# Patient Record
Sex: Male | Born: 1945 | Race: White | Hispanic: No | Marital: Married | Smoking: Former smoker
Health system: Southern US, Community
[De-identification: ages and names within clinical notes are randomized; demographics above are authoritative.]

## PROBLEM LIST (undated history)

## (undated) DIAGNOSIS — H9190 Unspecified hearing loss, unspecified ear: Secondary | ICD-10-CM

## (undated) DIAGNOSIS — L570 Actinic keratosis: Secondary | ICD-10-CM

## (undated) DIAGNOSIS — C4492 Squamous cell carcinoma of skin, unspecified: Secondary | ICD-10-CM

## (undated) DIAGNOSIS — M1990 Unspecified osteoarthritis, unspecified site: Secondary | ICD-10-CM

## (undated) DIAGNOSIS — C801 Malignant (primary) neoplasm, unspecified: Secondary | ICD-10-CM

## (undated) DIAGNOSIS — Z8619 Personal history of other infectious and parasitic diseases: Secondary | ICD-10-CM

## (undated) DIAGNOSIS — H409 Unspecified glaucoma: Secondary | ICD-10-CM

## (undated) HISTORY — PX: TONSILLECTOMY: SUR1361

## (undated) HISTORY — DX: Actinic keratosis: L57.0

## (undated) HISTORY — PX: PILONIDAL CYST EXCISION: SHX744

## (undated) HISTORY — PX: GANGLION CYST EXCISION: SHX1691

## (undated) HISTORY — DX: Squamous cell carcinoma of skin, unspecified: C44.92

---

## 2012-06-26 DIAGNOSIS — Z8619 Personal history of other infectious and parasitic diseases: Secondary | ICD-10-CM

## 2012-06-26 HISTORY — DX: Personal history of other infectious and parasitic diseases: Z86.19

## 2014-08-03 ENCOUNTER — Ambulatory Visit: Payer: Self-pay | Admitting: Physician Assistant

## 2014-08-03 LAB — CBC WITH DIFFERENTIAL/PLATELET
Basophil #: 0.1 10*3/uL (ref 0.0–0.1)
Basophil %: 0.9 %
EOS PCT: 1.6 %
Eosinophil #: 0.1 10*3/uL (ref 0.0–0.7)
HCT: 38.4 % — ABNORMAL LOW (ref 40.0–52.0)
HGB: 13.1 g/dL (ref 13.0–18.0)
Lymphocyte #: 1.8 10*3/uL (ref 1.0–3.6)
Lymphocyte %: 19 %
MCH: 30.8 pg (ref 26.0–34.0)
MCHC: 34.1 g/dL (ref 32.0–36.0)
MCV: 91 fL (ref 80–100)
MONO ABS: 0.6 x10 3/mm (ref 0.2–1.0)
Monocyte %: 6.3 %
NEUTROS ABS: 6.7 10*3/uL — AB (ref 1.4–6.5)
Neutrophil %: 72.2 %
PLATELETS: 251 10*3/uL (ref 150–440)
RBC: 4.24 10*6/uL — ABNORMAL LOW (ref 4.40–5.90)
RDW: 12.7 % (ref 11.5–14.5)
WBC: 9.3 10*3/uL (ref 3.8–10.6)

## 2014-08-03 LAB — URIC ACID: Uric Acid: 6.1 mg/dL (ref 3.5–7.2)

## 2014-08-06 ENCOUNTER — Ambulatory Visit: Payer: Self-pay

## 2016-06-16 ENCOUNTER — Encounter
Admission: RE | Admit: 2016-06-16 | Discharge: 2016-06-16 | Disposition: A | Discharge status: Home / Self Care | Payer: Medicare Other | Source: Ambulatory Visit | Attending: Surgery | Admitting: Surgery

## 2016-06-16 DIAGNOSIS — K4090 Unilateral inguinal hernia, without obstruction or gangrene, not specified as recurrent: Secondary | ICD-10-CM | POA: Diagnosis not present

## 2016-06-16 DIAGNOSIS — Z01810 Encounter for preprocedural cardiovascular examination: Secondary | ICD-10-CM | POA: Diagnosis present

## 2016-06-16 DIAGNOSIS — E785 Hyperlipidemia, unspecified: Secondary | ICD-10-CM | POA: Diagnosis not present

## 2016-06-16 HISTORY — DX: Malignant (primary) neoplasm, unspecified: C80.1

## 2016-06-16 HISTORY — DX: Unspecified hearing loss, unspecified ear: H91.90

## 2016-06-16 HISTORY — DX: Unspecified osteoarthritis, unspecified site: M19.90

## 2016-06-16 HISTORY — DX: Personal history of other infectious and parasitic diseases: Z86.19

## 2016-06-16 HISTORY — DX: Unspecified glaucoma: H40.9

## 2016-06-16 NOTE — Patient Instructions (Signed)
  Your procedure is scheduled on: June 23, 2916 (Friday) Report to Same Day Surgery 2nd floor medical mall [facility] [address] Entrance-take elevator on left to 2nd floor.  Check in with surgery information desk.) To find out your arrival time please call [phone] between 1PM - 3PM on June 22, 2016 (Thursday(  Remember: Instructions that are not followed completely may result in serious medical risk, up to and including death, or upon the discretion of your surgeon and anesthesiologist your surgery may need to be rescheduled.    _x___ 1. Do not eat food or drink liquids after midnight. No gum chewing or hard candies.     __x__ 2. No Alcohol for 24 hours before or after surgery.   __x__3. No Smoking for 24 prior to surgery.   ____  4. Bring all medications with you on the day of surgery if instructed.    __x__ 5. Notify your doctor if there is any change in your medical condition     (cold, fever, infections).     Do not wear jewelry, make-up, hairpins, clips or nail polish.  Do not wear lotions, powders, or perfumes. You may wear deodorant.  Do not shave 48 hours prior to surgery. Men may shave face and neck.  Do not bring valuables to the hospital.    [facility] is not responsible for any belongings or valuables.               Contacts, dentures or bridgework may not be worn into surgery.  Leave your suitcase in the car. After surgery it may be brought to your room.  For patients admitted to the hospital, discharge time is determined by your treatment team.   Patients discharged the day of surgery will not be allowed to drive home.  You will need someone to drive you home and stay with you the night of your procedure.    Please read over the following fact sheets that you were given:   [facility] Preparing for Surgery and or MRSA Information   __ Take these medicines the morning of surgery with A SIP OF WATER:    1.   2.  3.  4.  5.  6.  ____Fleets enema or  Magnesium Citrate as directed.   _x___ Use CHG Soap or sage wipes as directed on instruction sheet   ____ Use inhalers on the day of surgery and bring to hospital day of surgery  ____ Stop metformin 2 days prior to surgery    ____ Take 1/2 of usual insulin dose the night before surgery and none on the morning of           surgery.   _x___ Stop Aspirin, Coumadin, Pllavix ,Eliquis, Effient, or Pradaxa (STOP ASPIRIN TODAY)  x__ Stop Anti-inflammatories such as Advil, Aleve, Ibuprofen, Motrin, Naproxen,          Naprosyn, Goodies powders or aspirin products. Ok to take Tylenol.   _x___ Stop supplements until after surgery.   (STOP GLUCOSAMINE CHONDROITIN NOW)  ____ Bring C-Pap to the hospital.

## 2016-06-23 ENCOUNTER — Ambulatory Visit: Payer: Medicare Other | Admitting: Certified Registered"

## 2016-06-23 ENCOUNTER — Ambulatory Visit
Admission: RE | Admit: 2016-06-23 | Discharge: 2016-06-23 | Disposition: A | Discharge status: Home / Self Care | Payer: Medicare Other | Source: Ambulatory Visit | Attending: Surgery | Admitting: Surgery

## 2016-06-23 ENCOUNTER — Encounter
Admission: RE | Disposition: A | Discharge status: Home / Self Care | Payer: Self-pay | Source: Ambulatory Visit | Attending: Surgery

## 2016-06-23 ENCOUNTER — Encounter: Payer: Self-pay | Admitting: *Deleted

## 2016-06-23 DIAGNOSIS — D176 Benign lipomatous neoplasm of spermatic cord: Secondary | ICD-10-CM | POA: Diagnosis not present

## 2016-06-23 DIAGNOSIS — Z7982 Long term (current) use of aspirin: Secondary | ICD-10-CM | POA: Insufficient documentation

## 2016-06-23 DIAGNOSIS — K4020 Bilateral inguinal hernia, without obstruction or gangrene, not specified as recurrent: Secondary | ICD-10-CM | POA: Insufficient documentation

## 2016-06-23 DIAGNOSIS — M1990 Unspecified osteoarthritis, unspecified site: Secondary | ICD-10-CM | POA: Diagnosis not present

## 2016-06-23 DIAGNOSIS — Z87891 Personal history of nicotine dependence: Secondary | ICD-10-CM | POA: Diagnosis not present

## 2016-06-23 HISTORY — PX: INGUINAL HERNIA REPAIR: SHX194

## 2016-06-23 SURGERY — REPAIR, HERNIA, INGUINAL, ADULT
Anesthesia: General | Laterality: Bilateral | Wound class: Clean Contaminated

## 2016-06-23 MED ORDER — FENTANYL CITRATE (PF) 100 MCG/2ML IJ SOLN
INTRAMUSCULAR | Status: DC | PRN
  Administered 2016-06-23 (×4): 50 ug via INTRAVENOUS

## 2016-06-23 MED ORDER — ONDANSETRON HCL 4 MG/2ML IJ SOLN
INTRAMUSCULAR | Status: DC | PRN
Start: 2016-06-23 — End: 2016-06-23
  Administered 2016-06-23: 4 mg via INTRAVENOUS

## 2016-06-23 MED ORDER — MIDAZOLAM HCL 2 MG/2ML IJ SOLN
INTRAMUSCULAR | Status: DC | PRN
  Administered 2016-06-23: 2 mg via INTRAVENOUS

## 2016-06-23 MED ORDER — ONDANSETRON HCL 4 MG/2ML IJ SOLN
INTRAMUSCULAR | Status: AC
  Filled 2016-06-23: qty 2

## 2016-06-23 MED ORDER — PROPOFOL 10 MG/ML IV BOLUS
INTRAVENOUS | Status: AC
  Filled 2016-06-23: qty 20

## 2016-06-23 MED ORDER — PHENYLEPHRINE 40 MCG/ML (10ML) SYRINGE FOR IV PUSH (FOR BLOOD PRESSURE SUPPORT)
PREFILLED_SYRINGE | INTRAVENOUS | Status: AC
  Filled 2016-06-23: qty 10

## 2016-06-23 MED ORDER — FENTANYL CITRATE (PF) 250 MCG/5ML IJ SOLN
INTRAMUSCULAR | Status: AC
  Filled 2016-06-23: qty 5

## 2016-06-23 MED ORDER — PHENYLEPHRINE HCL 10 MG/ML IJ SOLN
INTRAMUSCULAR | Status: DC | PRN
  Administered 2016-06-23 (×3): 120 ug via INTRAVENOUS

## 2016-06-23 MED ORDER — BUPIVACAINE HCL (PF) 0.5 % IJ SOLN
INTRAMUSCULAR | Status: AC
  Filled 2016-06-23: qty 30

## 2016-06-23 MED ORDER — FAMOTIDINE 20 MG PO TABS
ORAL_TABLET | ORAL | Status: AC
  Administered 2016-06-23: 20 mg
  Filled 2016-06-23: qty 1

## 2016-06-23 MED ORDER — LACTATED RINGERS IV SOLN
INTRAVENOUS | Status: DC
  Administered 2016-06-23: 09:00:00 via INTRAVENOUS

## 2016-06-23 MED ORDER — ROCURONIUM BROMIDE 100 MG/10ML IV SOLN
INTRAVENOUS | Status: DC | PRN
  Administered 2016-06-23: 30 mg via INTRAVENOUS
  Administered 2016-06-23 (×2): 20 mg via INTRAVENOUS
  Administered 2016-06-23: 30 mg via INTRAVENOUS

## 2016-06-23 MED ORDER — CEFAZOLIN SODIUM-DEXTROSE 2-4 GM/100ML-% IV SOLN
INTRAVENOUS | Status: AC
  Filled 2016-06-23: qty 100

## 2016-06-23 MED ORDER — DEXAMETHASONE SODIUM PHOSPHATE 10 MG/ML IJ SOLN
INTRAMUSCULAR | Status: DC | PRN
  Administered 2016-06-23: 10 mg via INTRAVENOUS

## 2016-06-23 MED ORDER — ROCURONIUM BROMIDE 50 MG/5ML IV SOSY
PREFILLED_SYRINGE | INTRAVENOUS | Status: AC
  Filled 2016-06-23: qty 5

## 2016-06-23 MED ORDER — FAMOTIDINE 20 MG PO TABS: 20.0000 mg | ORAL_TABLET | Freq: Once | ORAL | Status: DC

## 2016-06-23 MED ORDER — BUPIVACAINE-EPINEPHRINE (PF) 0.25% -1:200000 IJ SOLN
INTRAMUSCULAR | Status: DC | PRN
  Administered 2016-06-23 (×2): 15 mL via PERINEURAL

## 2016-06-23 MED ORDER — HYDROCODONE-ACETAMINOPHEN 5-325 MG PO TABS: 1.0000 | ORAL_TABLET | ORAL | 0 refills | Status: AC | PRN

## 2016-06-23 MED ORDER — SUGAMMADEX SODIUM 200 MG/2ML IV SOLN
INTRAVENOUS | Status: DC | PRN
  Administered 2016-06-23: 145 mg via INTRAVENOUS

## 2016-06-23 MED ORDER — LIDOCAINE HCL (CARDIAC) 20 MG/ML IV SOLN
INTRAVENOUS | Status: DC | PRN
Start: 2016-06-23 — End: 2016-06-23
  Administered 2016-06-23: 80 mg via INTRAVENOUS

## 2016-06-23 MED ORDER — ONDANSETRON HCL 4 MG/2ML IJ SOLN: 4.0000 mg | Freq: Once | INTRAMUSCULAR | Status: DC | PRN

## 2016-06-23 MED ORDER — BUPIVACAINE-EPINEPHRINE (PF) 0.25% -1:200000 IJ SOLN
INTRAMUSCULAR | Status: AC
  Filled 2016-06-23: qty 30

## 2016-06-23 MED ORDER — CEFAZOLIN SODIUM-DEXTROSE 2-4 GM/100ML-% IV SOLN
2.0000 g | Freq: Once | INTRAVENOUS | Status: AC
  Administered 2016-06-23: 2 g via INTRAVENOUS

## 2016-06-23 MED ORDER — EPHEDRINE SULFATE 50 MG/ML IJ SOLN
INTRAMUSCULAR | Status: DC | PRN
  Administered 2016-06-23: 10 mg via INTRAVENOUS
  Administered 2016-06-23: 15 mg via INTRAVENOUS

## 2016-06-23 MED ORDER — PROPOFOL 10 MG/ML IV BOLUS
INTRAVENOUS | Status: DC | PRN
  Administered 2016-06-23: 150 mg via INTRAVENOUS

## 2016-06-23 MED ORDER — DEXAMETHASONE SODIUM PHOSPHATE 10 MG/ML IJ SOLN
INTRAMUSCULAR | Status: AC
Start: 2016-06-23 — End: 2016-06-23
  Filled 2016-06-23: qty 1

## 2016-06-23 MED ORDER — HYDROCODONE-ACETAMINOPHEN 5-325 MG PO TABS
1.0000 | ORAL_TABLET | ORAL | Status: DC | PRN
  Administered 2016-06-23: 1 via ORAL

## 2016-06-23 MED ORDER — MIDAZOLAM HCL 2 MG/2ML IJ SOLN
INTRAMUSCULAR | Status: AC
  Filled 2016-06-23: qty 2

## 2016-06-23 MED ORDER — EPINEPHRINE PF 1 MG/ML IJ SOLN
INTRAMUSCULAR | Status: AC
  Filled 2016-06-23: qty 1

## 2016-06-23 MED ORDER — HYDROCODONE-ACETAMINOPHEN 5-325 MG PO TABS
ORAL_TABLET | ORAL | Status: AC
  Administered 2016-06-23: 1 via ORAL
  Filled 2016-06-23: qty 1

## 2016-06-23 MED ORDER — EPHEDRINE 5 MG/ML INJ
INTRAVENOUS | Status: AC
  Filled 2016-06-23: qty 10

## 2016-06-23 MED ORDER — SUGAMMADEX SODIUM 200 MG/2ML IV SOLN
INTRAVENOUS | Status: AC
  Filled 2016-06-23: qty 2

## 2016-06-23 MED ORDER — LIDOCAINE 2% (20 MG/ML) 5 ML SYRINGE
INTRAMUSCULAR | Status: AC
  Filled 2016-06-23: qty 5

## 2016-06-23 MED ORDER — FENTANYL CITRATE (PF) 100 MCG/2ML IJ SOLN: 25.0000 ug | INTRAMUSCULAR | Status: DC | PRN

## 2016-06-23 SURGICAL SUPPLY — 30 items
BLADE CLIPPER SURG (BLADE) ×3 IMPLANT
BLADE SURG 15 STRL LF DISP TIS (BLADE) ×1 IMPLANT
BLADE SURG 15 STRL SS (BLADE) ×2
CANISTER SUCT 1200ML W/VALVE (MISCELLANEOUS) ×3 IMPLANT
CHLORAPREP W/TINT 26ML (MISCELLANEOUS) ×3 IMPLANT
DERMABOND ADVANCED (GAUZE/BANDAGES/DRESSINGS) ×4
DERMABOND ADVANCED .7 DNX12 (GAUZE/BANDAGES/DRESSINGS) ×2 IMPLANT
DRAIN PENROSE 5/8X18 LTX STRL (WOUND CARE) ×3 IMPLANT
DRAPE LAPAROTOMY 77X122 PED (DRAPES) ×3 IMPLANT
ELECT REM PT RETURN 9FT ADLT (ELECTROSURGICAL) ×3
ELECTRODE REM PT RTRN 9FT ADLT (ELECTROSURGICAL) ×1 IMPLANT
GLOVE BIO SURGEON STRL SZ7.5 (GLOVE) ×3 IMPLANT
GLOVE BIOGEL PI IND STRL 6.5 (GLOVE) ×2 IMPLANT
GLOVE BIOGEL PI INDICATOR 6.5 (GLOVE) ×4
GLOVE SURG SYN 6.5 ES PF (GLOVE) ×3 IMPLANT
GOWN STRL REUS W/ TWL LRG LVL3 (GOWN DISPOSABLE) ×3 IMPLANT
GOWN STRL REUS W/TWL LRG LVL3 (GOWN DISPOSABLE) ×6
KIT RM TURNOVER STRD PROC AR (KITS) ×3 IMPLANT
LABEL OR SOLS (LABEL) IMPLANT
MESH SYNTHETIC 4X6 SOFT BARD (Mesh General) ×1 IMPLANT
MESH SYNTHETIC SOFT BARD 4X6 (Mesh General) ×2 IMPLANT
NEEDLE HYPO 25X1 1.5 SAFETY (NEEDLE) ×3 IMPLANT
NS IRRIG 500ML POUR BTL (IV SOLUTION) ×3 IMPLANT
PACK BASIN MINOR ARMC (MISCELLANEOUS) ×3 IMPLANT
SUT CHROMIC 4 0 RB 1X27 (SUTURE) ×3 IMPLANT
SUT MNCRL AB 4-0 PS2 18 (SUTURE) ×6 IMPLANT
SUT SURGILON 0 30 BLK (SUTURE) ×21 IMPLANT
SUT VIC AB 4-0 SH 27 (SUTURE) ×4
SUT VIC AB 4-0 SH 27XANBCTRL (SUTURE) ×2 IMPLANT
SYRINGE 10CC LL (SYRINGE) ×3 IMPLANT

## 2016-06-23 NOTE — Transfer of Care (Signed)
Immediate Anesthesia Transfer of Care Note  Patient: Frank Fitzpatrick  Procedure(s) Performed: Procedure(s): HERNIA REPAIR INGUINAL ADULT (Bilateral)  Patient Location: PACU  Anesthesia Type:General  Level of Consciousness: awake, oriented and patient cooperative  Airway & Oxygen Therapy: Patient Spontanous Breathing and Patient connected to face mask oxygen  Post-op Assessment: Report given to RN, Post -op Vital signs reviewed and stable and Patient moving all extremities X 4  Post vital signs: Reviewed and stable  Last Vitals:  Vitals:   06/23/16 0758  BP: (!) 151/72  Pulse: 70  Resp: 16  Temp: 36.8 C    Last Pain:  Vitals:   06/23/16 0758  TempSrc: Temporal  PainSc: 0-No pain         Complications: No apparent anesthesia complications

## 2016-06-23 NOTE — Anesthesia Procedure Notes (Signed)
Procedure Name: Intubation Date/Time: 06/23/2016 9:41 AM Performed by: Silvana Newness Pre-anesthesia Checklist: Patient identified, Emergency Drugs available, Suction available, Patient being monitored and Timeout performed Patient Re-evaluated:Patient Re-evaluated prior to inductionOxygen Delivery Method: Circle system utilized Preoxygenation: Pre-oxygenation with 100% oxygen Intubation Type: IV induction Ventilation: Mask ventilation without difficulty Laryngoscope Size: Mac and 4 Grade View: Grade I Tube type: Oral Tube size: 7.5 mm Number of attempts: 1 Airway Equipment and Method: Stylet Placement Confirmation: ETT inserted through vocal cords under direct vision,  positive ETCO2 and breath sounds checked- equal and bilateral Secured at: 22 cm Tube secured with: Tape Dental Injury: Teeth and Oropharynx as per pre-operative assessment

## 2016-06-23 NOTE — Discharge Instructions (Addendum)
Take Tylenol or Norco if needed for pain.  Should not drive or do anything dangerous when taking Norco.  May shower.  Avoid straining and heavy lifting.  AMBULATORY SURGERY  DISCHARGE INSTRUCTIONS   1) The drugs that you were given will stay in your system until tomorrow so for the next 24 hours you should not:  A) Drive an automobile B) Make any legal decisions C) Drink any alcoholic beverage   2) You may resume regular meals tomorrow.  Today it is better to start with liquids and gradually work up to solid foods.  You may eat anything you prefer, but it is better to start with liquids, then soup and crackers, and gradually work up to solid foods.   3) Please notify your doctor immediately if you have any unusual bleeding, trouble breathing, redness and pain at the surgery site, drainage, fever, or pain not relieved by medication.    4) Additional Instructions:        Please contact your physician with any problems or Same Day Surgery at [phone], Monday through Friday 6 am to 4 pm, or [place] at [facility] number at [phone].AMBULATORY

## 2016-06-23 NOTE — Progress Notes (Signed)
Dr. Tamala Julian into see   Pt up to bathroom and voided

## 2016-06-23 NOTE — Anesthesia Preprocedure Evaluation (Signed)
Anesthesia Evaluation  Patient identified by MRN, date of birth, ID band Patient awake    Reviewed: Allergy & Precautions, NPO status , Patient's Chart, lab work & pertinent test results  Airway Mallampati: II  TM Distance: >3 FB     Dental  (+) Partial Upper, Partial Lower   Pulmonary former smoker,    Pulmonary exam normal        Cardiovascular negative cardio ROS Normal cardiovascular exam     Neuro/Psych negative neurological ROS  negative psych ROS   GI/Hepatic negative GI ROS, Neg liver ROS,   Endo/Other  negative endocrine ROS  Renal/GU negative Renal ROS  negative genitourinary   Musculoskeletal  (+) Arthritis , Osteoarthritis,    Abdominal Normal abdominal exam  (+)   Peds negative pediatric ROS (+)  Hematology negative hematology ROS (+)   Anesthesia Other Findings   Reproductive/Obstetrics                             Anesthesia Physical Anesthesia Plan  ASA: II  Anesthesia Plan: General   Post-op Pain Management:    Induction: Intravenous  Airway Management Planned: Oral ETT  Additional Equipment:   Intra-op Plan:   Post-operative Plan: Extubation in OR  Informed Consent: I have reviewed the patients History and Physical, chart, labs and discussed the procedure including the risks, benefits and alternatives for the proposed anesthesia with the patient or authorized representative who has indicated his/her understanding and acceptance.   Dental advisory given  Plan Discussed with: CRNA and Surgeon  Anesthesia Plan Comments:         Anesthesia Quick Evaluation

## 2016-06-23 NOTE — Op Note (Signed)
OPERATIVE REPORT  PREOPERATIVE DIAGNOSIS: bilateral inguinal hernia  POSTOPERATIVE DIAGNOSIS:bilateral  inguinal hernia  PROCEDURE:  bilateral inguinal hernia repair  ANESTHESIA:  General  SURGEON:  Rochel Brome M.D.  INDICATIONS: He has had pain in the right groin and just recently some minor pain in the left groin. Bilateral inguinal hernias were demonstrated on physical exam and repair is recommended for definitive treatment.  With the patient on the operating table in the supine position the bilateral lower quadrant was prepared with clippers and with ChloraPrep and draped in a sterile manner. A transversely oriented suprapubic incision was made on the right side and carried down through subcutaneous tissues. Electrocautery was used for hemostasis. The Scarpa's fascia was incised. The external oblique aponeurosis was incised along the course of its fibers to open the external ring and expose the inguinal cord structures. The cord structures were mobilized. A Penrose drain was passed around the cord structures for traction. Cremaster fibers were separated to expose an indirect hernia sac. The sac was approximately 7 cm in length and was dissected free from surrounding structures up to the internal ring. The sac was opened and its continuity with the peritoneal cavity was demonstrated. A high ligation of the sac was done with a 0 Surgilon suture ligature and the sac was excised. This was not submitted for pathology. There was also significant weakness of the floor of the inguinal canal area this was repaired with interrupted 0 Surgilon suturing the conjoined tendon to the shelving edge of the inguinal ligament incorporating transversalis fascia into the repair. The last stitch led to satisfactory narrowing of the internal ring. A relaxing incision was made medially. Bard soft mesh was cut to create an oval shape and was placed over the repair. This was sutured to the repair with interrupted 0  Surgilon sutures and also sutured medial to the relaxing incision to the deep fascia and on both sides of the internal ring. Next after seeing hemostasis was intact the cord structures were replaced along the floor of the inguinal canal. The cut edges of the external oblique aponeurosis were closed with a running 4-0 Vicryl suture to re-create the external ring. The deep fascia superior and lateral to the repair site was infiltrated with quarter percent Sensorcaine with epinephrine. Subcutaneous tissues were also infiltrated. The Scarpa's fascia was closed with interrupted 4-0 Vicryl sutures. The skin was closed with running 4-0 Monocryl subcuticular suture.  Seeing the patient was in satisfactory condition attention was turned to the left side. A transversely oriented suprapubic incision was made  and carried down through subcutaneous tissues. Electrocautery was used for hemostasis. The Scarpa's fascia was incised. The external oblique aponeurosis was incised along the course of its fibers to open the external ring and expose the inguinal cord structures. The cord structures were mobilized. A Penrose drain was passed around the cord structures for traction. Cremaster fibers were separated to expose an indirect hernia sac. The sac was dissected free from surrounding structures. It was opened and its continuity with the peritoneal cavity was demonstrated. This sac was approximately 5 cm in length. A high ligation of the sac was done with a 0 Surgilon suture ligature and the sac was excised and the stump allowed to retract. Also a cord lipoma which is approximately 4 cm in length was dissected free from surrounding structures and a high ligation was done with 4-0 Vicryl suture ligature and this lipoma was removed and was not submitted for pathology. There was marked weakness of the  floor of the inguinal canal. This was repaired with interrupted 0 Surgilon sutures suturing the conjoined tendon to the shelving edge of  the inguinal ligament incorporating transversalis fascia into the repair. The last stitch led to satisfactory narrowing of the internal ring. A relaxing incision was made medially. Bard soft mesh was cut to create an oval shape and was placed over the repair. This was sutured to the repair with interrupted 0 Surgilon sutures and also sutured medial to the relaxing incision to the deep fascia and on both sides of the internal ring. Next after seeing hemostasis was intact the cord structures were replaced along the floor of the inguinal canal. The cut edges of the external oblique aponeurosis were closed with a running 4-0 Vicryl suture to re-create the external ring. The deep fascia superior and lateral to the repair site was infiltrated with quarter percent Sensorcaine with epinephrine. Subcutaneous tissues were also infiltrated. The Scarpa's fascia was closed with interrupted 4-0 Vicryl sutures. The skin was closed with running 4-0 Monocryl subcuticular suture. Both wounds were treated with Dermabond  The patient appeared to be in satisfactory condition and was prepared for transfer to the recovery room.    Rochel Brome M.D.

## 2016-06-23 NOTE — H&P (Signed)
  He reports no change in condition since the day of the office exam.  I discussed the plan for bilateral inguinal hernia repair. Both sides were marked YES.  Lab work reviewed

## 2016-06-25 NOTE — Anesthesia Postprocedure Evaluation (Signed)
Anesthesia Post Note  Patient: Frank Fitzpatrick  Procedure(s) Performed: Procedure(s) (LRB): HERNIA REPAIR INGUINAL ADULT (Bilateral)  Patient location during evaluation: PACU Anesthesia Type: General Level of consciousness: awake and alert and oriented Pain management: pain level controlled Vital Signs Assessment: post-procedure vital signs reviewed and stable Respiratory status: spontaneous breathing Cardiovascular status: blood pressure returned to baseline Anesthetic complications: no     Last Vitals:  Vitals:   06/23/16 1321 06/23/16 1447  BP: (!) 108/53 (!) 111/59  Pulse: 64 63  Resp: 14   Temp: (!) 35.9 C     Last Pain:  Vitals:   06/23/16 1447  TempSrc:   PainSc: 1                  Kierria Feigenbaum

## 2016-06-27 ENCOUNTER — Encounter: Payer: Self-pay | Admitting: Surgery

## 2016-09-08 IMAGING — CR DG ANKLE COMPLETE 3+V*L*
3 series · 3 of 3 positions shown · non-contrast
Comparison: None.

CLINICAL DATA: Progressed soft tissue swelling and redness over the
lateral malleolus. No known injury.

EXAM:
LEFT ANKLE COMPLETE - 3+ VIEW

[ankle ap]
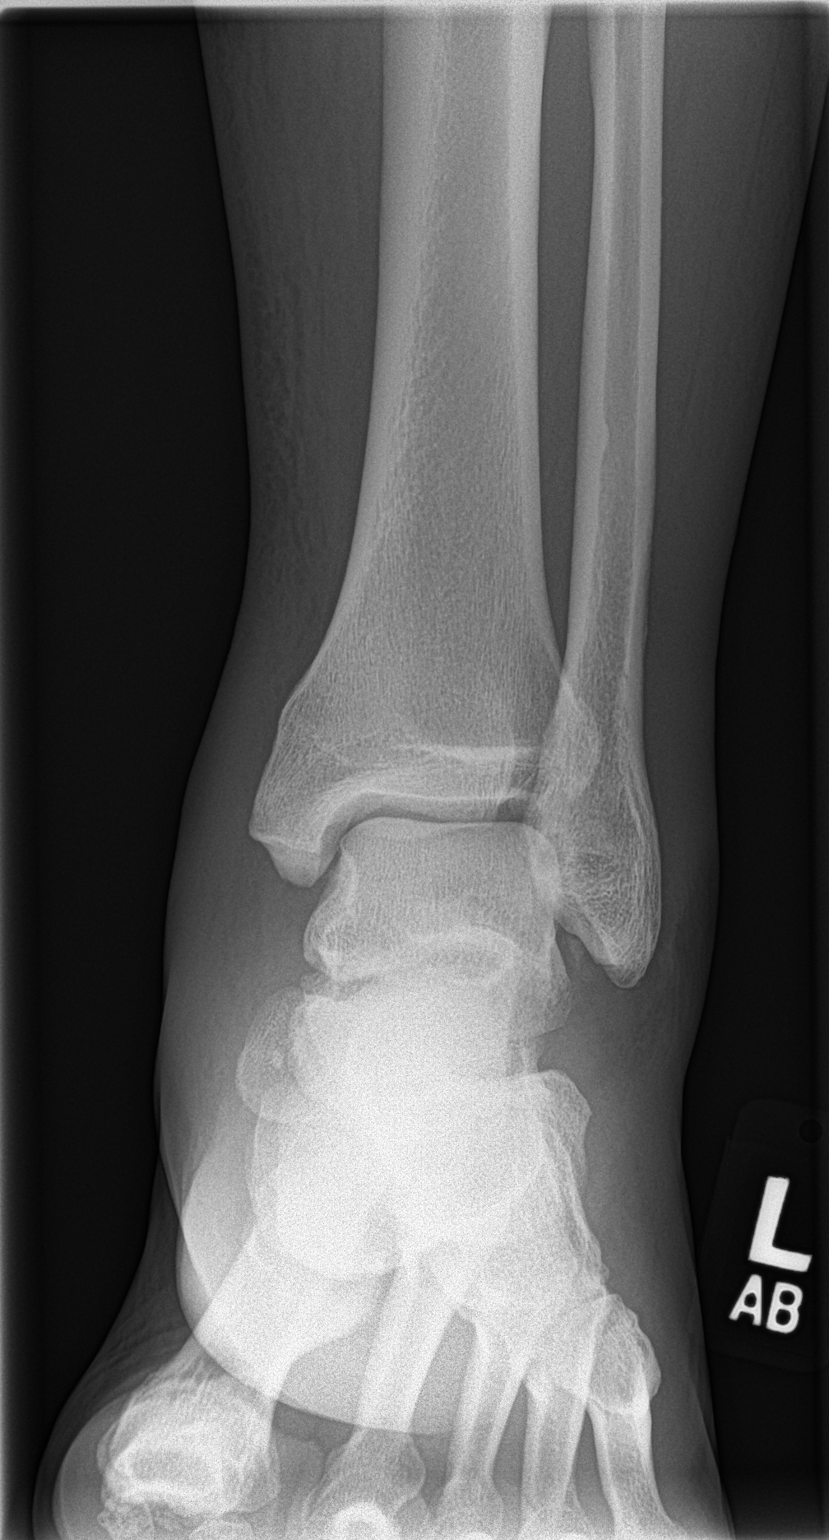

[ankle obl]
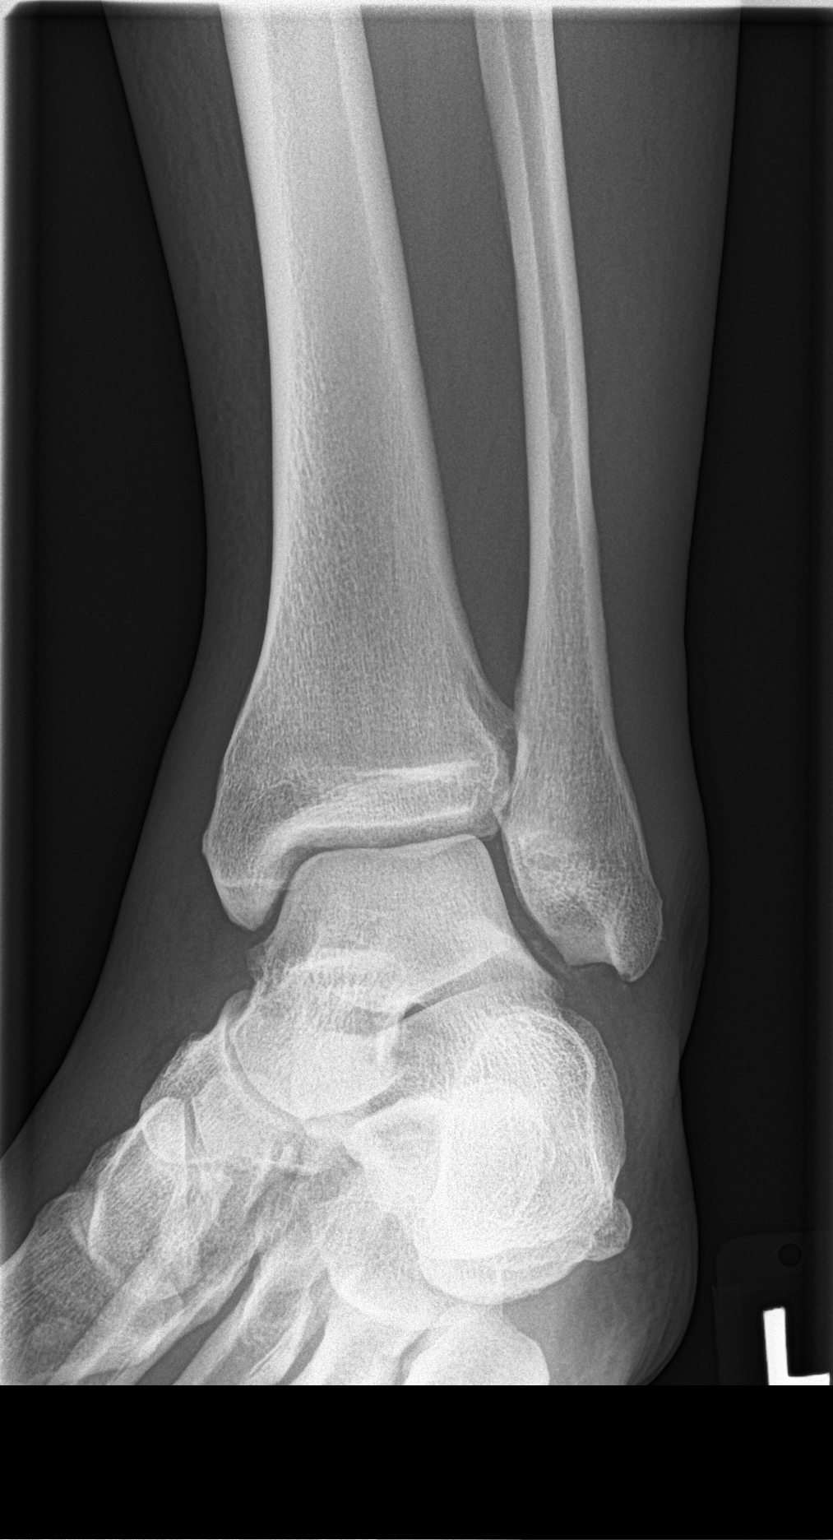

[ankle lat]
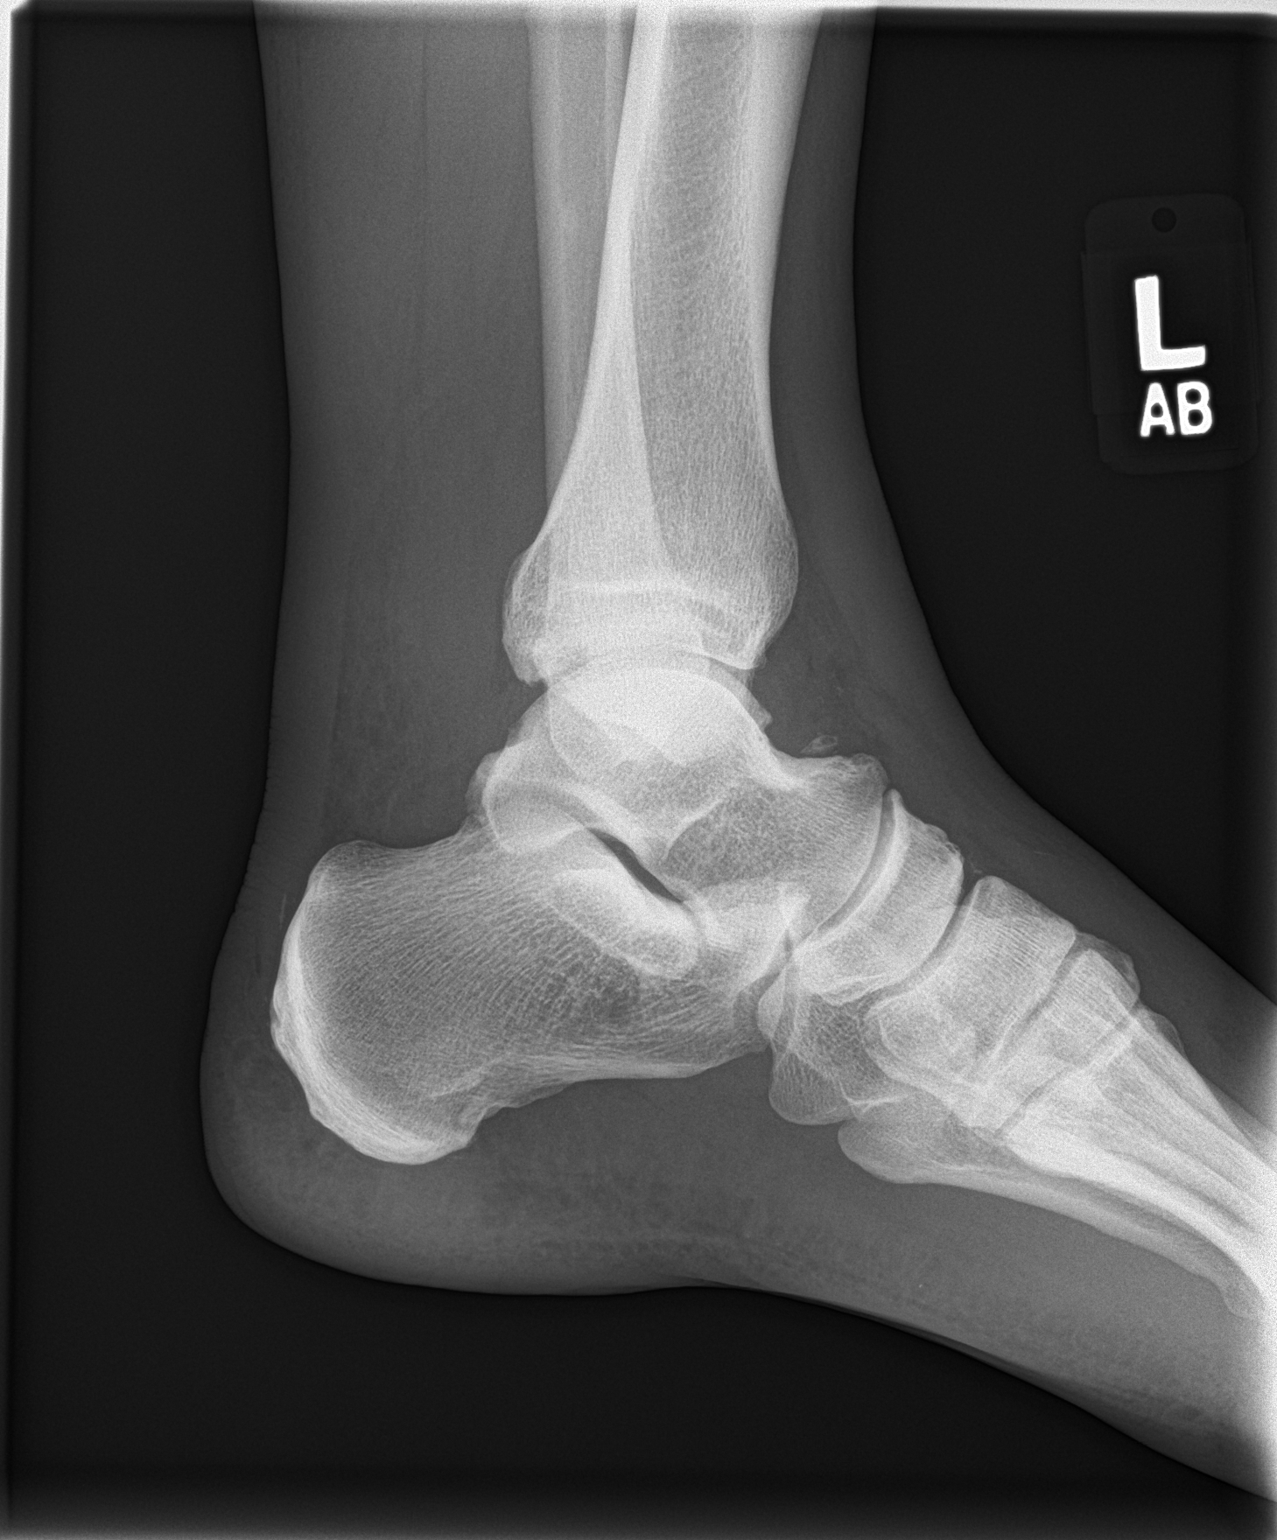

[3 of 3 positions shown; findings below may reference images not displayed]

FINDINGS: Soft tissue swelling about the ankle with possible ankle joint
effusion. There is amorphous soft tissue calcification/
mineralization in the lateral clear space. Questionable tiny erosion
at the tip of the fibula. No acute fracture or malalignment.
IMPRESSION: 1. Soft tissue swelling and possible ankle joint effusion.
2. Chondrocalcinosis or other mineralization in the lateral clear
space, question crystalline arthritis.

## 2021-02-22 ENCOUNTER — Other Ambulatory Visit: Payer: Self-pay

## 2021-02-22 ENCOUNTER — Ambulatory Visit: Payer: Medicare Other | Admitting: Dermatology

## 2021-02-22 DIAGNOSIS — L820 Inflamed seborrheic keratosis: Secondary | ICD-10-CM | POA: Diagnosis not present

## 2021-02-22 DIAGNOSIS — L814 Other melanin hyperpigmentation: Secondary | ICD-10-CM

## 2021-02-22 DIAGNOSIS — L570 Actinic keratosis: Secondary | ICD-10-CM

## 2021-02-22 DIAGNOSIS — L578 Other skin changes due to chronic exposure to nonionizing radiation: Secondary | ICD-10-CM

## 2021-02-22 DIAGNOSIS — L720 Epidermal cyst: Secondary | ICD-10-CM | POA: Diagnosis not present

## 2021-02-22 DIAGNOSIS — Z85828 Personal history of other malignant neoplasm of skin: Secondary | ICD-10-CM

## 2021-02-22 DIAGNOSIS — L738 Other specified follicular disorders: Secondary | ICD-10-CM

## 2021-02-22 NOTE — Patient Instructions (Signed)
   Pre-Operative Instructions  You are scheduled for a surgical procedure at [facility]. We recommend you read the following instructions. If you have any questions or concerns, please call the office at [phone].  Shower and wash the entire body with soap and water the day of your surgery paying special attention to cleansing at and around the planned surgery site.  Avoid aspirin or aspirin containing products at least fourteen (14) days prior to your surgical procedure and for at least one week (7 Days) after your surgical procedure. If you take aspirin on a regular basis for heart disease or history of stroke or for any other reason, we may recommend you continue taking aspirin but please notify us if you take this on a regular basis. Aspirin can cause more bleeding to occur during surgery as well as prolonged bleeding and bruising after surgery.   Avoid other nonsteroidal pain medications at least one week prior to surgery and at least one week prior to your surgery. These include medications such as Ibuprofen (Motrin, Advil and Nuprin), Naprosyn, Voltaren, Relafen, etc. If medications are used for therapeutic reasons, please inform us as they can cause increased bleeding or prolonged bleeding during and bruising after surgical procedures.   Please advise us if you are taking any "blood thinner" medications such as Coumadin or Dipyridamole or Plavix or similar medications. These cause increased bleeding and prolonged bleeding during procedures and bruising after surgical procedures. We may have to consider discontinuing these medications briefly prior to and shortly after your surgery if safe to do so.   Please inform us of all medications you are currently taking. All medications that are taken regularly should be taken the day of surgery as you always do. Nevertheless, we need to be informed of what medications you are taking prior to surgery to know whether they will affect the  procedure or cause any complications.   Please inform us of any medication allergies. Also inform us of whether you have allergies to Latex or rubber products or whether you have had any adverse reaction to Lidocaine or Epinephrine.  Please inform us of any prosthetic or artificial body parts such as artificial heart valve, joint replacements, etc., or similar condition that might require preoperative antibiotics.   We recommend avoidance of alcohol at least two weeks prior to surgery and continued avoidance for at least two weeks after surgery.   We recommend discontinuation of tobacco smoking at least two weeks prior to surgery and continued abstinence for at least two weeks after surgery.  Do not plan strenuous exercise, strenuous work or strenuous lifting for approximately four weeks after your surgery.   We request if you are unable to make your scheduled surgical appointment, please call us at least a week in advance or as soon as you are aware of a problem so that we can cancel or reschedule the appointment.   You MAY TAKE TYLENOL (acetaminophen) for pain as it is not a blood thinner.   PLEASE PLAN TO BE IN TOWN FOR TWO WEEKS FOLLOWING SURGERY, THIS IS IMPORTANT SO YOU CAN BE CHECKED FOR DRESSING CHANGES, SUTURE REMOVAL AND TO MONITOR FOR POSSIBLE COMPLICATIONS.  

## 2021-02-22 NOTE — Progress Notes (Signed)
New Patient Visit  Subjective  Frank Fitzpatrick is a 75 y.o. male who presents for the following: Spot Check (Pt c/o a few cysts behind his right ear, a spot on his right cheek that he frequently catches with the razor when shaving, and a spot on his right ear that has been bothering him. 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  ).    Objective  Well appearing patient in no apparent distress; mood and affect are within normal limits.  A focused examination was performed including neck, face, right ear. Relevant physical exam findings are noted in the Assessment and Plan.  Right occiptal hairline 1.5 cm firm subcutaneous nodule  right malar cheek Erythematous keratotic or waxy stuck-on papule   Right Superior Ear Helix 3 mm firm white papule  Head - Anterior (Face) Small yellow papules with a central dell.   right cheek x 1, left ear helix x 1 (2) Erythematous thin papules/macules with gritty scale.   Assessment & Plan  Epidermal cyst Right occiptal hairline  Benign-appearing. Exam most consistent with an epidermal inclusion cyst. Discussed that a cyst is a benign growth that can grow over time and sometimes get irritated or inflamed. Recommend observation if it is not bothersome. Discussed option of surgical excision to remove it if it is growing, symptomatic, or other changes noted. Please call for new or changing lesions so they can be evaluated.    Cyst with symptoms and/or recent change.  Discussed surgical excision to remove, including resulting scar and possible recurrence.  Patient will schedule for surgery. Pre-op information given.      Inflamed seborrheic keratosis right malar cheek     Destruction of lesion - right malar cheek  Destruction method: cryotherapy   Informed consent: discussed and consent obtained   Lesion destroyed using liquid nitrogen: Yes   Region frozen until ice ball extended beyond lesion: Yes   Outcome: patient tolerated procedure well  with no complications   Post-procedure details: wound care instructions given   Additional details:  Prior to procedure, discussed risks of blister formation, small wound, skin dyspigmentation, or rare scar following cryotherapy. Recommend Vaseline ointment to treated areas while healing.   Milia Right Superior Ear Helix  Discussed can be removed if bothersome with extraction. Pt would like it removed.    Acne/Milia surgery - Right Superior Ear Helix Procedure risks and benefits were discussed with the patient and verbal consent was obtained. Following prep of the skin on the right superior ear helix with an alcohol swab, extraction of milia was performed with two cotton swabs following superficial incision made over their surfaces with a #11 surgical blade. Capillary hemostasis was achieved with 20% aluminum chloride solution. Vaseline ointment was applied to each site. The patient tolerated the procedure well.  Sebaceous hyperplasia Head - Anterior (Face)  Benign, observe.    AK (actinic keratosis) (2) right cheek x 1, left ear helix x 1  Actinic keratoses are precancerous spots that appear secondary to cumulative UV radiation exposure/sun exposure over time. They are chronic with expected duration over 1 year. A portion of actinic keratoses will progress to squamous cell carcinoma of the skin. It is not possible to reliably predict which spots will progress to skin cancer and so treatment is recommended to prevent development of skin cancer.  Recommend daily broad spectrum sunscreen SPF 30+ to sun-exposed areas, reapply every 2 hours as needed.  Recommend staying in the shade or wearing long sleeves, sun  glasses (UVA+UVB protection) and wide brim hats (4-inch brim around the entire circumference of the hat). Call for new or changing lesions.   Prior to procedure, discussed risks of blister formation, small wound, skin dyspigmentation, or rare scar following cryotherapy. Recommend  Vaseline ointment to treated areas while healing.   Destruction of lesion - right cheek x 1, left ear helix x 1  Destruction method: cryotherapy   Informed consent: discussed and consent obtained   Lesion destroyed using liquid nitrogen: Yes   Region frozen until ice ball extended beyond lesion: Yes   Outcome: patient tolerated procedure well with no complications   Post-procedure details: wound care instructions given    History of Squamous Cell Carcinoma of the Skin - No evidence of recurrence today - Recommend regular full body skin exams - Recommend daily broad spectrum sunscreen SPF 30+ to sun-exposed areas, reapply every 2 hours as needed.  - Call if any new or changing lesions are noted between office visits  Actinic Damage - chronic, secondary to cumulative UV radiation exposure/sun exposure over time - diffuse scaly erythematous macules with underlying dyspigmentation - Recommend daily broad spectrum sunscreen SPF 30+ to sun-exposed areas, reapply every 2 hours as needed.  - Recommend staying in the shade or wearing long sleeves, sun glasses (UVA+UVB protection) and wide brim hats (4-inch brim around the entire circumference of the hat). - Call for new or changing lesions.  Lentigines - Scattered tan macules - Due to sun exposure - Benign-appering, observe - Recommend daily broad spectrum sunscreen SPF 30+ to sun-exposed areas, reapply every 2 hours as needed. - Call for any changes   Return for first avail surgery for cyst on r posterior neck.  I, Harriett Sine, CMA, am acting as scribe for Brendolyn Patty, MD.  Documentation: I have reviewed the above documentation for accuracy and completeness, and I agree with the above.  Brendolyn Patty MD

## 2022-03-06 ENCOUNTER — Ambulatory Visit (INDEPENDENT_AMBULATORY_CARE_PROVIDER_SITE_OTHER): Payer: Medicare Other | Admitting: Dermatology

## 2022-03-06 ENCOUNTER — Ambulatory Visit: Payer: Medicare Other | Admitting: Dermatology

## 2022-03-06 DIAGNOSIS — L720 Epidermal cyst: Secondary | ICD-10-CM | POA: Diagnosis not present

## 2022-03-06 DIAGNOSIS — D485 Neoplasm of uncertain behavior of skin: Secondary | ICD-10-CM

## 2022-03-06 NOTE — Progress Notes (Signed)
   Follow-Up Visit   Subjective  Frank Fitzpatrick is a 76 y.o. male who presents for the following: Cyst (R post neck/occipital scalp. Patient presents for excision. ).   The following portions of the chart were reviewed this encounter and updated as appropriate:       Review of Systems:  No other skin or systemic complaints except as noted in HPI or Assessment and Plan.  Objective  Well appearing patient in no apparent distress; mood and affect are within normal limits.  A focused examination was performed including face, neck, scalp. Relevant physical exam findings are noted in the Assessment and Plan.  Right Occipital Scalp at Hairline 1.7 cm firm sq nodule     Assessment & Plan  Neoplasm of uncertain behavior of skin Right Occipital Scalp at Hairline  Skin excision  Lesion length (cm):  1.7 Lesion width (cm):  1.7 Margin per side (cm):  0.1 Total excision diameter (cm):  1.9 Informed consent: discussed and consent obtained   Timeout: patient name, date of birth, surgical site, and procedure verified   Procedure prep:  Patient was prepped and draped in usual sterile fashion Prep type:  Povidone-iodine Anesthesia: the lesion was anesthetized in a standard fashion   Anesthetic:  1% lidocaine w/ epinephrine 1-100,000 buffered w/ 8.4% NaHCO3 (Total 9cc - 6cc lido w/epi, 3cc bupivicaine) Instrument used comment:  #15c blade Hemostasis achieved with: pressure   Outcome: patient tolerated procedure well with no complications    Skin repair Complexity:  Intermediate Final length (cm):  1.5 Informed consent: discussed and consent obtained   Timeout: patient name, date of birth, surgical site, and procedure verified   Reason for type of repair: reduce tension to allow closure, reduce the risk of dehiscence, infection, and necrosis, reduce subcutaneous dead space and avoid a hematoma, preserve normal anatomical and functional relationships and enhance both functionality  and cosmetic results   Undermining: edges could be approximated without difficulty and edges undermined   Subcutaneous layers (deep stitches):  Suture size:  4-0 Suture type: Vicryl (polyglactin 910)   Stitches:  Buried vertical mattress Fine/surface layer approximation (top stitches):  Suture size:  4-0 Suture type: nylon   Stitches: simple interrupted   Suture removal (days):  7 Hemostasis achieved with: suture Outcome: patient tolerated procedure well with no complications   Post-procedure details: sterile dressing applied and wound care instructions given   Dressing type: pressure dressing (mupirocin)    Specimen 1 - Surgical pathology Differential Diagnosis: Cyst vs other Check Margins: No 1.7 cm firm sq nodule   Return in about 1 week (around 03/13/2022) for suture removal.  I, Jamesetta Orleans, CMA, am acting as scribe for Brendolyn Patty, MD .  Documentation: I have reviewed the above documentation for accuracy and completeness, and I agree with the above.  Brendolyn Patty MD

## 2022-03-06 NOTE — Patient Instructions (Signed)
Wound Care Instructions for After Surgery  On the day following your surgery, you should begin doing daily dressing changes until your sutures are removed: Remove the bandage. Cleanse the wound gently with soap and water.  Make sure you then dry the skin surrounding the wound completely or the tape will not stick to the skin. Do not use cotton balls on the wound. After the wound is clean and dry, apply the ointment (either prescription antibiotic prescribed by your doctor or plain Vaseline if nothing was prescribed) gently with a Q-tip. If you are using a bandaid to cover: Apply a bandaid large enough to cover the entire wound. If you do not have a bandaid large enough to cover the wound OR if you are sensitive to bandaid adhesive: Cut a non-stick pad (such as Telfa) to fit the size of the wound.  Cover the wound with the non-stick pad. If the wound is draining, you may want to add a small amount of gauze on top of the non-stick pad for a little added compression to the area. Use tape to seal the area completely.  For the next 1-2 weeks: Be sure to keep the wound moist with ointment 24/7 to ensure best healing. If you are unable to cover the wound with a bandage to hold the ointment in place, you may need to reapply the ointment several times a day. Do not bend over or lift heavy items to reduce the chance of elevated blood pressure to the wound. Do not participate in particularly strenuous activities.  Below is a list of dressing supplies you might need.  Cotton-tipped applicators - Q-tips Gauze pads (2x2 and/or 4x4) - All-Purpose Sponges New and clean tube of petroleum jelly (Vaseline) OR prescription antibiotic ointment if prescribed Either a bandaid large enough to cover the entire wound OR non-stick dressing material (Telfa) and Tape (Paper or Hypafix)  FOR ADULT SURGERY PATIENTS: If you need something for pain relief, you may take 1 extra strength Tylenol (acetaminophen) and 2  ibuprofen (200 mg) together every 4 hours as needed. (Do not take these medications if you are allergic to them or if you know you cannot take them for any other reason). Typically you may only need pain medication for 1-3 days.   Comments on the Post-Operative Period Slight swelling and redness often appear around the wound. This is normal and will disappear within several days following the surgery. The healing wound will drain a brownish-red-yellow discharge during healing. This is a normal phase of wound healing. As the wound begins to heal, the drainage may increase in amount. Again, this drainage is normal. Notify us if the drainage becomes persistently bloody, excessively swollen, or intensely painful or develops a foul odor or red streaks.  The healing wound will also typically be itchy. This is normal. If you have severe or persistent pain, Notify us if the discomfort is severe or persistent. Avoid alcoholic beverages when taking pain medicine.  In Case of Wound Hemorrhage A wound hemorrhage is when the bandage suddenly becomes soaked with bright red blood and flows profusely. If this happens, sit down or lie down with your head elevated. If the wound has a dressing on it, do not remove the dressing. Apply pressure to the existing gauze. If the wound is not covered, use a gauze pad to apply pressure and continue applying the pressure for 20 minutes without peeking. DO NOT COVER THE WOUND WITH A LARGE TOWEL OR WASH CLOTH. Release your hand from the   wound site but do not remove the dressing. If the bleeding has stopped, gently clean around the wound. Leave the dressing in place for 24 hours if possible. This wait time allows the blood vessels to close off so that you do not spark a new round of bleeding by disrupting the newly clotted blood vessels with an immediate dressing change. If the bleeding does not subside, continue to hold pressure for 40 minutes. If bleeding continues, page your  physician, contact an After Hours clinic or go to the Emergency Room.  Due to recent changes in healthcare laws, you may see results of your pathology and/or laboratory studies on MyChart before the doctors have had a chance to review them. We understand that in some cases there may be results that are confusing or concerning to you. Please understand that not all results are received at the same time and often the doctors may need to interpret multiple results in order to provide you with the best plan of care or course of treatment. Therefore, we ask that you please give us 2 business days to thoroughly review all your results before contacting the office for clarification. Should we see a critical lab result, you will be contacted sooner.   If You Need Anything After Your Visit  If you have any questions or concerns for your doctor, please call our main line at [phone] and press option 4 to reach your doctor's medical assistant. If no one answers, please leave a voicemail as directed and we will return your call as soon as possible. Messages left after 4 pm will be answered the following business day.   You may also send us a message via MyChart. We typically respond to MyChart messages within 1-2 business days.  For prescription refills, please ask your pharmacy to contact our office. Our fax number is [phone].  If you have an urgent issue when the clinic is closed that cannot wait until the next business day, you can page your doctor at the number below.    Please note that while we do our best to be available for urgent issues outside of office hours, we are not available 24/7.   If you have an urgent issue and are unable to reach us, you may choose to seek medical care at your doctor's office, retail clinic, urgent care center, or emergency room.  If you have a medical emergency, please immediately call 911 or go to the emergency department.  Pager Numbers  - Dr. Kowalski:  [phone]  - Dr. Moye: [phone]  - Dr. Stewart: [phone]  In the event of inclement weather, please call our main line at [phone] for an update on the status of any delays or closures.  Dermatology Medication Tips: Please keep the boxes that topical medications come in in order to help keep track of the instructions about where and how to use these. Pharmacies typically print the medication instructions only on the boxes and not directly on the medication tubes.   If your medication is too expensive, please contact our office at [phone] option 4 or send us a message through MyChart.   We are unable to tell what your co-pay for medications will be in advance as this is different depending on your insurance coverage. However, we may be able to find a substitute medication at lower cost or fill out paperwork to get insurance to cover a needed medication.   If a prior authorization is required to get your medication covered by   your insurance company, please allow us 1-2 business days to complete this process.  Drug prices often vary depending on where the prescription is filled and some pharmacies may offer cheaper prices.  The website www.goodrx.com contains coupons for medications through different pharmacies. The prices here do not account for what the cost may be with help from insurance (it may be cheaper with your insurance), but the website can give you the price if you did not use any insurance.  - You can print the associated coupon and take it with your prescription to the pharmacy.  - You may also stop by our office during regular business hours and pick up a GoodRx coupon card.  - If you need your prescription sent electronically to a different pharmacy, notify our office through [facility] MyChart or by phone at [phone] option 4.     Si Usted Necesita Algo Despus de Su Visita  Tambin puede enviarnos un mensaje a travs de MyChart. Por lo general  respondemos a los mensajes de MyChart en el transcurso de 1 a 2 das hbiles.  Para renovar recetas, por favor pida a su farmacia que se ponga en contacto con nuestra oficina. Nuestro nmero de fax es el [phone].  Si tiene un asunto urgente cuando la clnica est cerrada y que no puede esperar hasta el siguiente da hbil, puede llamar/localizar a su doctor(a) al nmero que aparece a continuacin.   Por favor, tenga en cuenta que aunque hacemos todo lo posible para estar disponibles para asuntos urgentes fuera del horario de oficina, no estamos disponibles las 24 horas del da, los 7 das de la semana.   Si tiene un problema urgente y no puede comunicarse con nosotros, puede optar por buscar atencin mdica  en el consultorio de su doctor(a), en una clnica privada, en un centro de atencin urgente o en una sala de emergencias.  Si tiene una emergencia mdica, por favor llame inmediatamente al 911 o vaya a la sala de emergencias.  Nmeros de bper  - Dr. Kowalski: [phone]  - Dra. Moye: [phone]  - Dra. Stewart: [phone]  En caso de inclemencias del tiempo, por favor llame a nuestra lnea principal al [phone] para una actualizacin sobre el estado de cualquier retraso o cierre.  Consejos para la medicacin en dermatologa: Por favor, guarde las cajas en las que vienen los medicamentos de uso tpico para ayudarle a seguir las instrucciones sobre dnde y cmo usarlos. Las farmacias generalmente imprimen las instrucciones del medicamento slo en las cajas y no directamente en los tubos del medicamento.   Si su medicamento es muy caro, por favor, pngase en contacto con nuestra oficina llamando al [phone] y presione la opcin 4 o envenos un mensaje a travs de MyChart.   No podemos decirle cul ser su copago por los medicamentos por adelantado ya que esto es diferente dependiendo de la cobertura de su seguro. Sin embargo, es posible que podamos encontrar un  medicamento sustituto a menor costo o llenar un formulario para que el seguro cubra el medicamento que se considera necesario.   Si se requiere una autorizacin previa para que su compaa de seguros cubra su medicamento, por favor permtanos de 1 a 2 das hbiles para completar este proceso.  Los precios de los medicamentos varan con frecuencia dependiendo del lugar de dnde se surte la receta y alguna farmacias pueden ofrecer precios ms baratos.  El sitio web www.goodrx.com tiene cupones para medicamentos de diferentes farmacias. Los precios aqu no tienen   en cuenta lo que podra costar con la ayuda del seguro (puede ser ms barato con su seguro), pero el sitio web puede darle el precio si no utiliz ningn seguro.  - Puede imprimir el cupn correspondiente y llevarlo con su receta a la farmacia.  - Tambin puede pasar por nuestra oficina durante el horario de atencin regular y recoger una tarjeta de cupones de GoodRx.  - Si necesita que su receta se enve electrnicamente a una farmacia diferente, informe a nuestra oficina a travs de MyChart de [facility] o por telfono llamando al [phone] y presione la opcin 4.  

## 2022-03-07 ENCOUNTER — Telehealth: Payer: Self-pay

## 2022-03-07 NOTE — Telephone Encounter (Signed)
Talked to patient and he is doing fine from surgery yesterday.

## 2022-03-13 ENCOUNTER — Encounter: Payer: Self-pay | Admitting: Dermatology

## 2022-03-13 ENCOUNTER — Ambulatory Visit (INDEPENDENT_AMBULATORY_CARE_PROVIDER_SITE_OTHER): Payer: Medicare Other | Admitting: Dermatology

## 2022-03-13 DIAGNOSIS — L720 Epidermal cyst: Secondary | ICD-10-CM

## 2022-03-13 DIAGNOSIS — Z4802 Encounter for removal of sutures: Secondary | ICD-10-CM

## 2022-03-13 NOTE — Progress Notes (Signed)
   Follow-Up Visit   Subjective  Frank Fitzpatrick is a 76 y.o. male who presents for the following: Suture / Staple Removal. Post-op cyst excision   The following portions of the chart were reviewed this encounter and updated as appropriate:       Review of Systems:  No other skin or systemic complaints except as noted in HPI or Assessment and Plan.  Objective  Well appearing patient in no apparent distress; mood and affect are within normal limits.  A focused examination was performed including face, scalp. Relevant physical exam findings are noted in the Assessment and Plan.  R occipital scalp at hairline Healing excision site is clean, dry and intact     Assessment & Plan  Epidermal inclusion cyst R occipital scalp at hairline  Benign  Wound cleansed, sutures removed, wound cleansed. Discussed pathology results.    Return for UBSE in 3-6 months.  IJamesetta Orleans, CMA, am acting as scribe for Brendolyn Patty, MD .  Documentation: I have reviewed the above documentation for accuracy and completeness, and I agree with the above.  Brendolyn Patty MD

## 2022-03-13 NOTE — Patient Instructions (Signed)
Due to recent changes in healthcare laws, you may see results of your pathology and/or laboratory studies on MyChart before the doctors have had a chance to review them. We understand that in some cases there may be results that are confusing or concerning to you. Please understand that not all results are received at the same time and often the doctors may need to interpret multiple results in order to provide you with the best plan of care or course of treatment. Therefore, we ask that you please give us 2 business days to thoroughly review all your results before contacting the office for clarification. Should we see a critical lab result, you will be contacted sooner.   If You Need Anything After Your Visit  If you have any questions or concerns for your doctor, please call our main line at [phone] and press option 4 to reach your doctor's medical assistant. If no one answers, please leave a voicemail as directed and we will return your call as soon as possible. Messages left after 4 pm will be answered the following business day.   You may also send us a message via MyChart. We typically respond to MyChart messages within 1-2 business days.  For prescription refills, please ask your pharmacy to contact our office. Our fax number is [phone].  If you have an urgent issue when the clinic is closed that cannot wait until the next business day, you can page your doctor at the number below.    Please note that while we do our best to be available for urgent issues outside of office hours, we are not available 24/7.   If you have an urgent issue and are unable to reach us, you may choose to seek medical care at your doctor's office, retail clinic, urgent care center, or emergency room.  If you have a medical emergency, please immediately call 911 or go to the emergency department.  Pager Numbers  - Dr. Kowalski: [phone]  - Dr. Moye: [phone]  - Dr. Stewart:  [phone]  In the event of inclement weather, please call our main line at [phone] for an update on the status of any delays or closures.  Dermatology Medication Tips: Please keep the boxes that topical medications come in in order to help keep track of the instructions about where and how to use these. Pharmacies typically print the medication instructions only on the boxes and not directly on the medication tubes.   If your medication is too expensive, please contact our office at [phone] option 4 or send us a message through MyChart.   We are unable to tell what your co-pay for medications will be in advance as this is different depending on your insurance coverage. However, we may be able to find a substitute medication at lower cost or fill out paperwork to get insurance to cover a needed medication.   If a prior authorization is required to get your medication covered by your insurance company, please allow us 1-2 business days to complete this process.  Drug prices often vary depending on where the prescription is filled and some pharmacies may offer cheaper prices.  The website www.goodrx.com contains coupons for medications through different pharmacies. The prices here do not account for what the cost may be with help from insurance (it may be cheaper with your insurance), but the website can give you the price if you did not use any insurance.  - You can print the associated coupon and take it with   your prescription to the pharmacy.  - You may also stop by our office during regular business hours and pick up a GoodRx coupon card.  - If you need your prescription sent electronically to a different pharmacy, notify our office through [facility] MyChart or by phone at [phone] option 4.     Si Usted Necesita Algo Despus de Su Visita  Tambin puede enviarnos un mensaje a travs de MyChart. Por lo general respondemos a los mensajes de MyChart en el transcurso de 1 a 2  das hbiles.  Para renovar recetas, por favor pida a su farmacia que se ponga en contacto con nuestra oficina. Nuestro nmero de fax es el [phone].  Si tiene un asunto urgente cuando la clnica est cerrada y que no puede esperar hasta el siguiente da hbil, puede llamar/localizar a su doctor(a) al nmero que aparece a continuacin.   Por favor, tenga en cuenta que aunque hacemos todo lo posible para estar disponibles para asuntos urgentes fuera del horario de oficina, no estamos disponibles las 24 horas del da, los 7 das de la semana.   Si tiene un problema urgente y no puede comunicarse con nosotros, puede optar por buscar atencin mdica  en el consultorio de su doctor(a), en una clnica privada, en un centro de atencin urgente o en una sala de emergencias.  Si tiene una emergencia mdica, por favor llame inmediatamente al 911 o vaya a la sala de emergencias.  Nmeros de bper  - Dr. Kowalski: [phone]  - Dra. Moye: [phone]  - Dra. Stewart: [phone]  En caso de inclemencias del tiempo, por favor llame a nuestra lnea principal al [phone] para una actualizacin sobre el estado de cualquier retraso o cierre.  Consejos para la medicacin en dermatologa: Por favor, guarde las cajas en las que vienen los medicamentos de uso tpico para ayudarle a seguir las instrucciones sobre dnde y cmo usarlos. Las farmacias generalmente imprimen las instrucciones del medicamento slo en las cajas y no directamente en los tubos del medicamento.   Si su medicamento es muy caro, por favor, pngase en contacto con nuestra oficina llamando al [phone] y presione la opcin 4 o envenos un mensaje a travs de MyChart.   No podemos decirle cul ser su copago por los medicamentos por adelantado ya que esto es diferente dependiendo de la cobertura de su seguro. Sin embargo, es posible que podamos encontrar un medicamento sustituto a menor costo o llenar un formulario para que el  seguro cubra el medicamento que se considera necesario.   Si se requiere una autorizacin previa para que su compaa de seguros cubra su medicamento, por favor permtanos de 1 a 2 das hbiles para completar este proceso.  Los precios de los medicamentos varan con frecuencia dependiendo del lugar de dnde se surte la receta y alguna farmacias pueden ofrecer precios ms baratos.  El sitio web www.goodrx.com tiene cupones para medicamentos de diferentes farmacias. Los precios aqu no tienen en cuenta lo que podra costar con la ayuda del seguro (puede ser ms barato con su seguro), pero el sitio web puede darle el precio si no utiliz ningn seguro.  - Puede imprimir el cupn correspondiente y llevarlo con su receta a la farmacia.  - Tambin puede pasar por nuestra oficina durante el horario de atencin regular y recoger una tarjeta de cupones de GoodRx.  - Si necesita que su receta se enve electrnicamente a una farmacia diferente, informe a nuestra oficina a travs de MyChart de [facility]   o por telfono llamando al [phone] y presione la opcin 4.  

## 2022-09-11 ENCOUNTER — Ambulatory Visit: Payer: Medicare Other | Admitting: Dermatology

## 2022-09-12 ENCOUNTER — Ambulatory Visit: Payer: Medicare Other | Admitting: Dermatology

## 2022-09-12 ENCOUNTER — Encounter: Payer: Self-pay | Admitting: Dermatology

## 2022-09-12 VITALS — BP 143/76 | HR 78

## 2022-09-12 DIAGNOSIS — Z1283 Encounter for screening for malignant neoplasm of skin: Secondary | ICD-10-CM

## 2022-09-12 DIAGNOSIS — L821 Other seborrheic keratosis: Secondary | ICD-10-CM

## 2022-09-12 DIAGNOSIS — D229 Melanocytic nevi, unspecified: Secondary | ICD-10-CM

## 2022-09-12 DIAGNOSIS — T148XXA Other injury of unspecified body region, initial encounter: Secondary | ICD-10-CM | POA: Diagnosis not present

## 2022-09-12 DIAGNOSIS — C4491 Basal cell carcinoma of skin, unspecified: Secondary | ICD-10-CM

## 2022-09-12 DIAGNOSIS — D485 Neoplasm of uncertain behavior of skin: Secondary | ICD-10-CM

## 2022-09-12 DIAGNOSIS — L578 Other skin changes due to chronic exposure to nonionizing radiation: Secondary | ICD-10-CM | POA: Diagnosis not present

## 2022-09-12 DIAGNOSIS — C44619 Basal cell carcinoma of skin of left upper limb, including shoulder: Secondary | ICD-10-CM | POA: Diagnosis not present

## 2022-09-12 DIAGNOSIS — L570 Actinic keratosis: Secondary | ICD-10-CM

## 2022-09-12 DIAGNOSIS — L814 Other melanin hyperpigmentation: Secondary | ICD-10-CM

## 2022-09-12 DIAGNOSIS — Z85828 Personal history of other malignant neoplasm of skin: Secondary | ICD-10-CM

## 2022-09-12 DIAGNOSIS — L738 Other specified follicular disorders: Secondary | ICD-10-CM

## 2022-09-12 HISTORY — DX: Basal cell carcinoma of skin, unspecified: C44.91

## 2022-09-12 HISTORY — DX: Melanocytic nevi, unspecified: D22.9

## 2022-09-12 NOTE — Progress Notes (Signed)
Follow-Up Visit   Subjective  Frank Fitzpatrick is a 77 y.o. male who presents for the following: Follow-up.  The patient presents for Upper Body Skin Exam (UBSE) for skin cancer screening and mole check.  The patient has spots, moles and lesions to be evaluated, some may be new or changing. History of AKs. No history of skin cancer.    The following portions of the chart were reviewed this encounter and updated as appropriate:       Review of Systems:  No other skin or systemic complaints except as noted in HPI or Assessment and Plan.  Objective  Well appearing patient in no apparent distress; mood and affect are within normal limits.  All skin waist up examined.  Right posterior neck Pink excoriated papules x 3 linear.  Left Dorsal Hand x 4; Right Dorsal Hand x 1 (5) Keratotic papules.  R ant nasal ala Pink scaly macule  Left Lower Chest 78mm brown macule, slight irreg pigment, atypical pigment network under dermoscopy        Left Upper Arm 3.0 mm pink brown papule, with telangiectasia under dermoscopy        Left Chest 1.2 cm waxy speckled patch, features of collision lentigo/SK under dermoscopy         Assessment & Plan  Skin cancer screening performed today.  Actinic Damage - chronic, secondary to cumulative UV radiation exposure/sun exposure over time - diffuse scaly erythematous macules with underlying dyspigmentation - Recommend daily broad spectrum sunscreen SPF 30+ to sun-exposed areas, reapply every 2 hours as needed.  - Recommend staying in the shade or wearing long sleeves, sun glasses (UVA+UVB protection) and wide brim hats (4-inch brim around the entire circumference of the hat). - Call for new or changing lesions.   History of Squamous Cell Carcinoma of the Skin - No evidence of recurrence today of the right ear - Recommend regular full body skin exams - Recommend daily broad spectrum sunscreen SPF 30+ to sun-exposed areas,  reapply every 2 hours as needed.  - Call if any new or changing lesions are noted between office visits  Lentigines - Scattered tan macules - Due to sun exposure - Benign-appearing, observe - Recommend daily broad spectrum sunscreen SPF 30+ to sun-exposed areas, reapply every 2 hours as needed. - Call for any changes  Sebaceous Hyperplasia - Small yellow papules with a central dell - Benign - Observe  Seborrheic Keratoses - Stuck-on, waxy, tan-brown papules and/or plaques  - Benign-appearing - Discussed benign etiology and prognosis. - Observe - Call for any changes  Excoriation Right posterior neck  Not itchy per patient. Benign-appearing. Observe.  Recheck on f/u.    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 They are chronic with expected duration over 1 year. A portion of actinic keratoses will progress to squamous cell carcinoma of the skin. It is not possible to reliably predict which spots will progress to skin cancer and so treatment is recommended to prevent development of skin cancer.  Recommend daily broad spectrum sunscreen SPF 30+ to sun-exposed areas, reapply every 2 hours as needed.  Recommend staying in the shade or wearing long sleeves, sun glasses (UVA+UVB protection) and wide brim hats (4-inch brim around the entire circumference of the hat). Call for new or changing lesions.  Destruction of lesion - Left Dorsal Hand x 4;  Right Dorsal Hand x 1  Destruction method: cryotherapy   Informed consent: discussed and consent obtained   Lesion destroyed using liquid nitrogen: Yes   Region frozen until ice ball extended beyond lesion: Yes   Outcome: patient tolerated procedure well with no complications   Post-procedure details: wound care instructions given   Additional details:  Prior to procedure,  discussed risks of blister formation, small wound, skin dyspigmentation, or rare scar following cryotherapy. Recommend Vaseline ointment to treated areas while healing.   AK (actinic keratosis) R ant nasal ala  Actinic keratoses are precancerous spots that appear secondary to cumulative UV radiation exposure/sun exposure over time. They are chronic with expected duration over 1 year. A portion of actinic keratoses will progress to squamous cell carcinoma of the skin. It is not possible to reliably predict which spots will progress to skin cancer and so treatment is recommended to prevent development of skin cancer.  Recommend daily broad spectrum sunscreen SPF 30+ to sun-exposed areas, reapply every 2 hours as needed.  Recommend staying in the shade or wearing long sleeves, sun glasses (UVA+UVB protection) and wide brim hats (4-inch brim around the entire circumference of the hat). Call for new or changing lesions.  Destruction of lesion - R ant nasal ala  Destruction method: cryotherapy   Informed consent: discussed and consent obtained   Lesion destroyed using liquid nitrogen: Yes   Region frozen until ice ball extended beyond lesion: Yes   Outcome: patient tolerated procedure well with no complications   Post-procedure details: wound care instructions given   Additional details:  Prior to procedure, discussed risks of blister formation, small wound, skin dyspigmentation, or rare scar following cryotherapy. Recommend Vaseline ointment to treated areas while healing.   Neoplasm of uncertain behavior of skin (2) Left Lower Chest  Epidermal / dermal shaving  Lesion diameter (cm):  1.3 Informed consent: discussed and consent obtained   Patient was prepped and draped in usual sterile fashion: Area prepped with alcohol. Anesthesia: the lesion was anesthetized in a standard fashion   Anesthetic:  1% lidocaine w/ epinephrine 1-100,000 buffered w/ 8.4% NaHCO3 Instrument used: flexible razor  blade   Hemostasis achieved with: pressure, aluminum chloride and electrodesiccation   Outcome: patient tolerated procedure well   Post-procedure details: wound care instructions given   Post-procedure details comment:  Ointment and small bandage applied  Specimen 1 - Surgical pathology Differential Diagnosis: Lentigo r/o Atypia Check Margins: Yes  Left Upper Arm  Skin / nail biopsy Type of biopsy: tangential   Informed consent: discussed and consent obtained   Patient was prepped and draped in usual sterile fashion: Area prepped with alcohol. Anesthesia: the lesion was anesthetized in a standard fashion   Anesthetic:  1% lidocaine w/ epinephrine 1-100,000 buffered w/ 8.4% NaHCO3 Instrument used: flexible razor blade   Hemostasis achieved with: pressure, aluminum chloride and electrodesiccation   Outcome: patient tolerated procedure well   Post-procedure details: wound care instructions given   Post-procedure details comment:  Ointment and small bandage applied  Specimen 2 - Surgical pathology Differential Diagnosis: r/o Pigmented BCC vs other Check Margins: No    Seborrheic keratosis Left Chest  With adjacent lentigo.  Benign-appearing.  Observation.  Call clinic for new or changing lesions.  Recommend daily use of broad spectrum spf 30+ sunscreen to sun-exposed areas.    Recheck on f/up   Return in about 6 months (around 03/15/2023) for UBSE, Hx SCC, Hx AKs.  Lindi Adie, CMA, am acting as scribe  for Brendolyn Patty, MD .  Documentation: I have reviewed the above documentation for accuracy and completeness, and I agree with the above.  Brendolyn Patty MD

## 2022-09-12 NOTE — Patient Instructions (Addendum)
Wound Care Instructions  Cleanse wound gently with soap and water once a day then pat dry with clean gauze. Apply a thin coat of Petrolatum (petroleum jelly, "Vaseline") over the wound (unless you have an allergy to this). We recommend that you use a new, sterile tube of Vaseline. Do not pick or remove scabs. Do not remove the yellow or white "healing tissue" from the base of the wound.  Cover the wound with fresh, clean, nonstick gauze and secure with paper tape. You may use Band-Aids in place of gauze and tape if the wound is small enough, but would recommend trimming much of the tape off as there is often too much. Sometimes Band-Aids can irritate the skin.  You should call the office for your biopsy report after 1 week if you have not already been contacted.  If you experience any problems, such as abnormal amounts of bleeding, swelling, significant bruising, significant pain, or evidence of infection, please call the office immediately.  FOR ADULT SURGERY PATIENTS: If you need something for pain relief you may take 1 extra strength Tylenol (acetaminophen) AND 2 Ibuprofen (200mg each) together every 4 hours as needed for pain. (do not take these if you are allergic to them or if you have a reason you should not take them.) Typically, you may only need pain medication for 1 to 3 days.     Due to recent changes in healthcare laws, you may see results of your pathology and/or laboratory studies on MyChart before the doctors have had a chance to review them. We understand that in some cases there may be results that are confusing or concerning to you. Please understand that not all results are received at the same time and often the doctors may need to interpret multiple results in order to provide you with the best plan of care or course of treatment. Therefore, we ask that you please give us 2 business days to thoroughly review all your results before contacting the office for clarification. Should  we see a critical lab result, you will be contacted sooner.   If You Need Anything After Your Visit  If you have any questions or concerns for your doctor, please call our main line at [phone] and press option 4 to reach your doctor's medical assistant. If no one answers, please leave a voicemail as directed and we will return your call as soon as possible. Messages left after 4 pm will be answered the following business day.   You may also send us a message via MyChart. We typically respond to MyChart messages within 1-2 business days.  For prescription refills, please ask your pharmacy to contact our office. Our fax number is [phone].  If you have an urgent issue when the clinic is closed that cannot wait until the next business day, you can page your doctor at the number below.    Please note that while we do our best to be available for urgent issues outside of office hours, we are not available 24/7.   If you have an urgent issue and are unable to reach us, you may choose to seek medical care at your doctor's office, retail clinic, urgent care center, or emergency room.  If you have a medical emergency, please immediately call 911 or go to the emergency department.  Pager Numbers  - Dr. Kowalski: [phone]  - Dr. Moye: [phone]  - Dr. Stewart: [phone]  In the event of inclement weather, please call our main line at   [phone] for an update on the status of any delays or closures.  Dermatology Medication Tips: Please keep the boxes that topical medications come in in order to help keep track of the instructions about where and how to use these. Pharmacies typically print the medication instructions only on the boxes and not directly on the medication tubes.   If your medication is too expensive, please contact our office at [phone] option 4 or send us a message through MyChart.   We are unable to tell what your co-pay for medications will be in  advance as this is different depending on your insurance coverage. However, we may be able to find a substitute medication at lower cost or fill out paperwork to get insurance to cover a needed medication.   If a prior authorization is required to get your medication covered by your insurance company, please allow us 1-2 business days to complete this process.  Drug prices often vary depending on where the prescription is filled and some pharmacies may offer cheaper prices.  The website www.goodrx.com contains coupons for medications through different pharmacies. The prices here do not account for what the cost may be with help from insurance (it may be cheaper with your insurance), but the website can give you the price if you did not use any insurance.  - You can print the associated coupon and take it with your prescription to the pharmacy.  - You may also stop by our office during regular business hours and pick up a GoodRx coupon card.  - If you need your prescription sent electronically to a different pharmacy, notify our office through [facility] MyChart or by phone at [phone] option 4.     Si Usted Necesita Algo Despus de Su Visita  Tambin puede enviarnos un mensaje a travs de MyChart. Por lo general respondemos a los mensajes de MyChart en el transcurso de 1 a 2 das hbiles.  Para renovar recetas, por favor pida a su farmacia que se ponga en contacto con nuestra oficina. Nuestro nmero de fax es el [phone].  Si tiene un asunto urgente cuando la clnica est cerrada y que no puede esperar hasta el siguiente da hbil, puede llamar/localizar a su doctor(a) al nmero que aparece a continuacin.   Por favor, tenga en cuenta que aunque hacemos todo lo posible para estar disponibles para asuntos urgentes fuera del horario de oficina, no estamos disponibles las 24 horas del da, los 7 das de la semana.   Si tiene un problema urgente y no puede comunicarse con nosotros, puede  optar por buscar atencin mdica  en el consultorio de su doctor(a), en una clnica privada, en un centro de atencin urgente o en una sala de emergencias.  Si tiene una emergencia mdica, por favor llame inmediatamente al 911 o vaya a la sala de emergencias.  Nmeros de bper  - Dr. Kowalski: [phone]  - Dra. Moye: [phone]  - Dra. Stewart: [phone]  En caso de inclemencias del tiempo, por favor llame a nuestra lnea principal al [phone] para una actualizacin sobre el estado de cualquier retraso o cierre.  Consejos para la medicacin en dermatologa: Por favor, guarde las cajas en las que vienen los medicamentos de uso tpico para ayudarle a seguir las instrucciones sobre dnde y cmo usarlos. Las farmacias generalmente imprimen las instrucciones del medicamento slo en las cajas y no directamente en los tubos del medicamento.   Si su medicamento es muy caro, por favor, pngase en contacto con   nuestra oficina llamando al [phone] y presione la opcin 4 o envenos un mensaje a travs de MyChart.   No podemos decirle cul ser su copago por los medicamentos por adelantado ya que esto es diferente dependiendo de la cobertura de su seguro. Sin embargo, es posible que podamos encontrar un medicamento sustituto a menor costo o llenar un formulario para que el seguro cubra el medicamento que se considera necesario.   Si se requiere una autorizacin previa para que su compaa de seguros cubra su medicamento, por favor permtanos de 1 a 2 das hbiles para completar este proceso.  Los precios de los medicamentos varan con frecuencia dependiendo del lugar de dnde se surte la receta y alguna farmacias pueden ofrecer precios ms baratos.  El sitio web www.goodrx.com tiene cupones para medicamentos de diferentes farmacias. Los precios aqu no tienen en cuenta lo que podra costar con la ayuda del seguro (puede ser ms barato con su seguro), pero el sitio web puede darle el  precio si no utiliz ningn seguro.  - Puede imprimir el cupn correspondiente y llevarlo con su receta a la farmacia.  - Tambin puede pasar por nuestra oficina durante el horario de atencin regular y recoger una tarjeta de cupones de GoodRx.  - Si necesita que su receta se enve electrnicamente a una farmacia diferente, informe a nuestra oficina a travs de MyChart de [facility] o por telfono llamando al [phone] y presione la opcin 4.  

## 2022-09-18 ENCOUNTER — Telehealth: Payer: Self-pay

## 2022-09-18 NOTE — Telephone Encounter (Signed)
-----   Message from Brendolyn Patty, MD sent at 09/18/2022 12:26 PM EDT ----- 1. 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 Skin , left upper arm BASAL CELL CARCINOMA, NODULAR PATTERN, PIGMENTED  1. Atypical pigmented lesion- needs excision 2. BCC skin cancer- needs EDC   - please call patient

## 2022-09-18 NOTE — Telephone Encounter (Signed)
Advised patient and wife biopsy of the left lower chest was ATYPICAL JUNCTIONAL LENTIGINOUS MELANOCYTIC PROLIFERATION, and needs excision (11/27/22 at 2:30 PM) and biopsy of the left upper arm was BASAL CELL CARCINOMA, NODULAR PATTERN, PIGMENTED and needs EDC (12/04/22 at 9:15 AM).

## 2022-10-23 ENCOUNTER — Ambulatory Visit (INDEPENDENT_AMBULATORY_CARE_PROVIDER_SITE_OTHER): Payer: Medicare Other | Admitting: Dermatology

## 2022-10-23 ENCOUNTER — Encounter: Payer: Self-pay | Admitting: Dermatology

## 2022-10-23 DIAGNOSIS — D485 Neoplasm of uncertain behavior of skin: Secondary | ICD-10-CM | POA: Diagnosis not present

## 2022-10-23 NOTE — Progress Notes (Signed)
   Follow-Up Visit   Subjective  Frank Fitzpatrick is a 77 y.o. male who presents for the following: Excision of bx proven ATYPICAL JUNCTIONAL LENTIGINOUS MELANOCYTIC PROLIFERATION at left lower chest.  The following portions of the chart were reviewed this encounter and updated as appropriate: medications, allergies, medical history  Review of Systems:  No other skin or systemic complaints except as noted in HPI or Assessment and Plan.  Objective  Well appearing patient in no apparent distress; mood and affect are within normal limits.  A focused examination was performed of the following areas: chest Relevant physical exam findings are noted in the Assessment and Plan.   left lower chest Pink bx site.    Assessment & Plan   Neoplasm of uncertain behavior of skin left lower chest  Skin excision  Lesion length (cm):  1.5 Lesion width (cm):  0.9 Margin per side (cm):  0.2 Total excision diameter (cm):  1.9 Informed consent: discussed and consent obtained   Timeout: patient name, date of birth, surgical site, and procedure verified   Procedure prep:  Patient was prepped and draped in usual sterile fashion Prep type:  Povidone-iodine Anesthesia: the lesion was anesthetized in a standard fashion   Anesthetic:  1% lidocaine w/ epinephrine 1-100,000 buffered w/ 8.4% NaHCO3 (6 cc lido w/epi, 6 cc bupivicaine) Instrument used: #15 blade   Hemostasis achieved with: pressure and electrodesiccation   Outcome: patient tolerated procedure well with no complications   Additional details:  Tag at superior 12:00.  Skin repair Complexity:  Intermediate Final length (cm):  4.3 Informed consent: discussed and consent obtained   Timeout: patient name, date of birth, surgical site, and procedure verified   Reason for type of repair: reduce tension to allow closure, reduce the risk of dehiscence, infection, and necrosis, reduce subcutaneous dead space and avoid a hematoma, preserve  normal anatomical and functional relationships and enhance both functionality and cosmetic results   Undermining: edges undermined   Subcutaneous layers (deep stitches):  Suture size:  4-0 Suture type: Vicryl (polyglactin 910)   Subcutaneous suture technique: inverted dermal. Fine/surface layer approximation (top stitches):  Suture size:  4-0 Suture type: nylon   Stitches: simple interrupted   Suture removal (days):  7 Hemostasis achieved with: suture Outcome: patient tolerated procedure well with no complications   Post-procedure details: sterile dressing applied and wound care instructions given   Dressing type: pressure dressing (Mupirocin ointment)    Specimen 1 - Surgical pathology Differential Diagnosis: bx proven ATYPICAL JUNCTIONAL LENTIGINOUS MELANOCYTIC PROLIFERATION  Check Margins: yes [id number] Pink bx site **Tag at superior 12:00     Return for Suture Removal, EDC BCC L arm, as scheduled.  Anise Salvo, RMA, am acting as scribe for Willeen Niece, MD .   Documentation: I have reviewed the above documentation for accuracy and completeness, and I agree with the above.  Willeen Niece, MD

## 2022-10-23 NOTE — Patient Instructions (Signed)
Wound Care Instructions for After Surgery  On the day following your surgery, you should begin doing daily dressing changes until your sutures are removed: Remove the bandage. Cleanse the wound gently with soap and water.  Make sure you then dry the skin surrounding the wound completely or the tape will not stick to the skin. Do not use cotton balls on the wound. After the wound is clean and dry, apply the ointment (either prescription antibiotic prescribed by your doctor or plain Vaseline if nothing was prescribed) gently with a Q-tip. If you are using a bandaid to cover: Apply a bandaid large enough to cover the entire wound. If you do not have a bandaid large enough to cover the wound OR if you are sensitive to bandaid adhesive: Cut a non-stick pad (such as Telfa) to fit the size of the wound.  Cover the wound with the non-stick pad. If the wound is draining, you may want to add a small amount of gauze on top of the non-stick pad for a little added compression to the area. Use tape to seal the area completely.  For the next 1-2 weeks: Be sure to keep the wound moist with ointment 24/7 to ensure best healing. If you are unable to cover the wound with a bandage to hold the ointment in place, you may need to reapply the ointment several times a day. Do not bend over or lift heavy items to reduce the chance of elevated blood pressure to the wound. Do not participate in particularly strenuous activities.  Below is a list of dressing supplies you might need.  Cotton-tipped applicators - Q-tips Gauze pads (2x2 and/or 4x4) - [employer] Sponges New and clean tube of petroleum jelly (Vaseline) OR prescription antibiotic ointment if prescribed Either a bandaid large enough to cover the entire wound OR non-stick dressing material (Telfa) and Tape (Paper or Hypafix)  FOR ADULT SURGERY PATIENTS: If you need something for pain relief, you may take 1 extra strength Tylenol (acetaminophen) and 2  ibuprofen (200 mg) together every 4 hours as needed. (Do not take these medications if you are allergic to them or if you know you cannot take them for any other reason). Typically you may only need pain medication for 1-3 days.   Comments on the Post-Operative Period Slight swelling and redness often appear around the wound. This is normal and will disappear within several days following the surgery. The healing wound will drain a brownish-red-yellow discharge during healing. This is a normal phase of wound healing. As the wound begins to heal, the drainage may increase in amount. Again, this drainage is normal. Notify us if the drainage becomes persistently bloody, excessively swollen, or intensely painful or develops a foul odor or red streaks.  The healing wound will also typically be itchy. This is normal. If you have severe or persistent pain, Notify us if the discomfort is severe or persistent. Avoid alcoholic beverages when taking pain medicine.  In Case of Wound Hemorrhage A wound hemorrhage is when the bandage suddenly becomes soaked with bright red blood and flows profusely. If this happens, sit down or lie down with your head elevated. If the wound has a dressing on it, do not remove the dressing. Apply pressure to the existing gauze. If the wound is not covered, use a gauze pad to apply pressure and continue applying the pressure for 20 minutes without peeking. DO NOT COVER THE WOUND WITH A LARGE TOWEL OR WASH CLOTH. Release your hand from the   wound site but do not remove the dressing. If the bleeding has stopped, gently clean around the wound. Leave the dressing in place for 24 hours if possible. This wait time allows the blood vessels to close off so that you do not spark a new round of bleeding by disrupting the newly clotted blood vessels with an immediate dressing change. If the bleeding does not subside, continue to hold pressure for 40 minutes. If bleeding continues, page your  physician, contact an After Hours clinic or go to the Emergency Room.   Due to recent changes in healthcare laws, you may see results of your pathology and/or laboratory studies on MyChart before the doctors have had a chance to review them. We understand that in some cases there may be results that are confusing or concerning to you. Please understand that not all results are received at the same time and often the doctors may need to interpret multiple results in order to provide you with the best plan of care or course of treatment. Therefore, we ask that you please give us 2 business days to thoroughly review all your results before contacting the office for clarification. Should we see a critical lab result, you will be contacted sooner.   If You Need Anything After Your Visit  If you have any questions or concerns for your doctor, please call our main line at [phone] and press option 4 to reach your doctor's medical assistant. If no one answers, please leave a voicemail as directed and we will return your call as soon as possible. Messages left after 4 pm will be answered the following business day.   You may also send us a message via MyChart. We typically respond to MyChart messages within 1-2 business days.  For prescription refills, please ask your pharmacy to contact our office. Our fax number is [phone].  If you have an urgent issue when the clinic is closed that cannot wait until the next business day, you can page your doctor at the number below.    Please note that while we do our best to be available for urgent issues outside of office hours, we are not available 24/7.   If you have an urgent issue and are unable to reach us, you may choose to seek medical care at your doctor's office, retail clinic, urgent care center, or emergency room.  If you have a medical emergency, please immediately call 911 or go to the emergency department.  Pager Numbers  - Dr. Kowalski:  [phone]  - Dr. Moye: [phone]  - Dr. Stewart: [phone]  In the event of inclement weather, please call our main line at [phone] for an update on the status of any delays or closures.  Dermatology Medication Tips: Please keep the boxes that topical medications come in in order to help keep track of the instructions about where and how to use these. Pharmacies typically print the medication instructions only on the boxes and not directly on the medication tubes.   If your medication is too expensive, please contact our office at [phone] option 4 or send us a message through MyChart.   We are unable to tell what your co-pay for medications will be in advance as this is different depending on your insurance coverage. However, we may be able to find a substitute medication at lower cost or fill out paperwork to get insurance to cover a needed medication.   If a prior authorization is required to get your medication covered   by your insurance company, please allow us 1-2 business days to complete this process.  Drug prices often vary depending on where the prescription is filled and some pharmacies may offer cheaper prices.  The website www.goodrx.com contains coupons for medications through different pharmacies. The prices here do not account for what the cost may be with help from insurance (it may be cheaper with your insurance), but the website can give you the price if you did not use any insurance.  - You can print the associated coupon and take it with your prescription to the pharmacy.  - You may also stop by our office during regular business hours and pick up a GoodRx coupon card.  - If you need your prescription sent electronically to a different pharmacy, notify our office through [facility] MyChart or by phone at [phone] option 4.     Si Usted Necesita Algo Despus de Su Visita  Tambin puede enviarnos un mensaje a travs de MyChart. Por lo general  respondemos a los mensajes de MyChart en el transcurso de 1 a 2 das hbiles.  Para renovar recetas, por favor pida a su farmacia que se ponga en contacto con nuestra oficina. Nuestro nmero de fax es el [phone].  Si tiene un asunto urgente cuando la clnica est cerrada y que no puede esperar hasta el siguiente da hbil, puede llamar/localizar a su doctor(a) al nmero que aparece a continuacin.   Por favor, tenga en cuenta que aunque hacemos todo lo posible para estar disponibles para asuntos urgentes fuera del horario de oficina, no estamos disponibles las 24 horas del da, los 7 das de la semana.   Si tiene un problema urgente y no puede comunicarse con nosotros, puede optar por buscar atencin mdica  en el consultorio de su doctor(a), en una clnica privada, en un centro de atencin urgente o en una sala de emergencias.  Si tiene una emergencia mdica, por favor llame inmediatamente al 911 o vaya a la sala de emergencias.  Nmeros de bper  - Dr. Kowalski: [phone]  - Dra. Moye: [phone]  - Dra. Stewart: [phone]  En caso de inclemencias del tiempo, por favor llame a nuestra lnea principal al [phone] para una actualizacin sobre el estado de cualquier retraso o cierre.  Consejos para la medicacin en dermatologa: Por favor, guarde las cajas en las que vienen los medicamentos de uso tpico para ayudarle a seguir las instrucciones sobre dnde y cmo usarlos. Las farmacias generalmente imprimen las instrucciones del medicamento slo en las cajas y no directamente en los tubos del medicamento.   Si su medicamento es muy caro, por favor, pngase en contacto con nuestra oficina llamando al [phone] y presione la opcin 4 o envenos un mensaje a travs de MyChart.   No podemos decirle cul ser su copago por los medicamentos por adelantado ya que esto es diferente dependiendo de la cobertura de su seguro. Sin embargo, es posible que podamos encontrar un  medicamento sustituto a menor costo o llenar un formulario para que el seguro cubra el medicamento que se considera necesario.   Si se requiere una autorizacin previa para que su compaa de seguros cubra su medicamento, por favor permtanos de 1 a 2 das hbiles para completar este proceso.  Los precios de los medicamentos varan con frecuencia dependiendo del lugar de dnde se surte la receta y alguna farmacias pueden ofrecer precios ms baratos.  El sitio web www.goodrx.com tiene cupones para medicamentos de diferentes farmacias. Los precios aqu no   tienen en cuenta lo que podra costar con la ayuda del seguro (puede ser ms barato con su seguro), pero el sitio web puede darle el precio si no utiliz ningn seguro.  - Puede imprimir el cupn correspondiente y llevarlo con su receta a la farmacia.  - Tambin puede pasar por nuestra oficina durante el horario de atencin regular y recoger una tarjeta de cupones de GoodRx.  - Si necesita que su receta se enve electrnicamente a una farmacia diferente, informe a nuestra oficina a travs de MyChart de [place] o por telfono llamando al [phone] y presione la opcin 4.   

## 2022-10-30 ENCOUNTER — Ambulatory Visit: Payer: Medicare Other | Admitting: Dermatology

## 2022-10-30 ENCOUNTER — Encounter: Payer: Self-pay | Admitting: Dermatology

## 2022-10-30 VITALS — BP 150/74

## 2022-10-30 DIAGNOSIS — D101 Benign neoplasm of tongue: Secondary | ICD-10-CM

## 2022-10-30 DIAGNOSIS — C44619 Basal cell carcinoma of skin of left upper limb, including shoulder: Secondary | ICD-10-CM | POA: Diagnosis not present

## 2022-10-30 DIAGNOSIS — D225 Melanocytic nevi of trunk: Secondary | ICD-10-CM

## 2022-10-30 DIAGNOSIS — L578 Other skin changes due to chronic exposure to nonionizing radiation: Secondary | ICD-10-CM | POA: Diagnosis not present

## 2022-10-30 DIAGNOSIS — L738 Other specified follicular disorders: Secondary | ICD-10-CM | POA: Diagnosis not present

## 2022-10-30 DIAGNOSIS — D229 Melanocytic nevi, unspecified: Secondary | ICD-10-CM

## 2022-10-30 DIAGNOSIS — D489 Neoplasm of uncertain behavior, unspecified: Secondary | ICD-10-CM

## 2022-10-30 NOTE — Patient Instructions (Addendum)
Wound Care Instructions  Cleanse wound gently with soap and water once a day then pat dry with clean gauze. Apply a thin coat of Petrolatum (petroleum jelly, "Vaseline") over the wound (unless you have an allergy to this). We recommend that you use a new, sterile tube of Vaseline. Do not pick or remove scabs. Do not remove the yellow or white "healing tissue" from the base of the wound.  Cover the wound with fresh, clean, nonstick gauze and secure with paper tape. You may use Band-Aids in place of gauze and tape if the wound is small enough, but would recommend trimming much of the tape off as there is often too much. Sometimes Band-Aids can irritate the skin.  You should call the office for your biopsy report after 1 week if you have not already been contacted.  If you experience any problems, such as abnormal amounts of bleeding, swelling, significant bruising, significant pain, or evidence of infection, please call the office immediately.  FOR ADULT SURGERY PATIENTS: If you need something for pain relief you may take 1 extra strength Tylenol (acetaminophen) AND 2 Ibuprofen (200mg each) together every 4 hours as needed for pain. (do not take these if you are allergic to them or if you have a reason you should not take them.) Typically, you may only need pain medication for 1 to 3 days.     Due to recent changes in healthcare laws, you may see results of your pathology and/or laboratory studies on MyChart before the doctors have had a chance to review them. We understand that in some cases there may be results that are confusing or concerning to you. Please understand that not all results are received at the same time and often the doctors may need to interpret multiple results in order to provide you with the best plan of care or course of treatment. Therefore, we ask that you please give us 2 business days to thoroughly review all your results before contacting the office for clarification. Should  we see a critical lab result, you will be contacted sooner.   If You Need Anything After Your Visit  If you have any questions or concerns for your doctor, please call our main line at [phone] and press option 4 to reach your doctor's medical assistant. If no one answers, please leave a voicemail as directed and we will return your call as soon as possible. Messages left after 4 pm will be answered the following business day.   You may also send us a message via MyChart. We typically respond to MyChart messages within 1-2 business days.  For prescription refills, please ask your pharmacy to contact our office. Our fax number is [phone].  If you have an urgent issue when the clinic is closed that cannot wait until the next business day, you can page your doctor at the number below.    Please note that while we do our best to be available for urgent issues outside of office hours, we are not available 24/7.   If you have an urgent issue and are unable to reach us, you may choose to seek medical care at your doctor's office, retail clinic, urgent care center, or emergency room.  If you have a medical emergency, please immediately call 911 or go to the emergency department.  Pager Numbers  - Dr. Kowalski: [phone]  - Dr. Moye: [phone]  - Dr. Stewart: [phone]  In the event of inclement weather, please call our main line at   [phone] for an update on the status of any delays or closures.  Dermatology Medication Tips: Please keep the boxes that topical medications come in in order to help keep track of the instructions about where and how to use these. Pharmacies typically print the medication instructions only on the boxes and not directly on the medication tubes.   If your medication is too expensive, please contact our office at [phone] option 4 or send us a message through MyChart.   We are unable to tell what your co-pay for medications will be in  advance as this is different depending on your insurance coverage. However, we may be able to find a substitute medication at lower cost or fill out paperwork to get insurance to cover a needed medication.   If a prior authorization is required to get your medication covered by your insurance company, please allow us 1-2 business days to complete this process.  Drug prices often vary depending on where the prescription is filled and some pharmacies may offer cheaper prices.  The website www.goodrx.com contains coupons for medications through different pharmacies. The prices here do not account for what the cost may be with help from insurance (it may be cheaper with your insurance), but the website can give you the price if you did not use any insurance.  - You can print the associated coupon and take it with your prescription to the pharmacy.  - You may also stop by our office during regular business hours and pick up a GoodRx coupon card.  - If you need your prescription sent electronically to a different pharmacy, notify our office through [facility] MyChart or by phone at [phone] option 4.     Si Usted Necesita Algo Despus de Su Visita  Tambin puede enviarnos un mensaje a travs de MyChart. Por lo general respondemos a los mensajes de MyChart en el transcurso de 1 a 2 das hbiles.  Para renovar recetas, por favor pida a su farmacia que se ponga en contacto con nuestra oficina. Nuestro nmero de fax es el [phone].  Si tiene un asunto urgente cuando la clnica est cerrada y que no puede esperar hasta el siguiente da hbil, puede llamar/localizar a su doctor(a) al nmero que aparece a continuacin.   Por favor, tenga en cuenta que aunque hacemos todo lo posible para estar disponibles para asuntos urgentes fuera del horario de oficina, no estamos disponibles las 24 horas del da, los 7 das de la semana.   Si tiene un problema urgente y no puede comunicarse con nosotros, puede  optar por buscar atencin mdica  en el consultorio de su doctor(a), en una clnica privada, en un centro de atencin urgente o en una sala de emergencias.  Si tiene una emergencia mdica, por favor llame inmediatamente al 911 o vaya a la sala de emergencias.  Nmeros de bper  - Dr. Kowalski: [phone]  - Dra. Moye: [phone]  - Dra. Stewart: [phone]  En caso de inclemencias del tiempo, por favor llame a nuestra lnea principal al [phone] para una actualizacin sobre el estado de cualquier retraso o cierre.  Consejos para la medicacin en dermatologa: Por favor, guarde las cajas en las que vienen los medicamentos de uso tpico para ayudarle a seguir las instrucciones sobre dnde y cmo usarlos. Las farmacias generalmente imprimen las instrucciones del medicamento slo en las cajas y no directamente en los tubos del medicamento.   Si su medicamento es muy caro, por favor, pngase en contacto con   nuestra oficina llamando al [phone] y presione la opcin 4 o envenos un mensaje a travs de MyChart.   No podemos decirle cul ser su copago por los medicamentos por adelantado ya que esto es diferente dependiendo de la cobertura de su seguro. Sin embargo, es posible que podamos encontrar un medicamento sustituto a menor costo o llenar un formulario para que el seguro cubra el medicamento que se considera necesario.   Si se requiere una autorizacin previa para que su compaa de seguros cubra su medicamento, por favor permtanos de 1 a 2 das hbiles para completar este proceso.  Los precios de los medicamentos varan con frecuencia dependiendo del lugar de dnde se surte la receta y alguna farmacias pueden ofrecer precios ms baratos.  El sitio web www.goodrx.com tiene cupones para medicamentos de diferentes farmacias. Los precios aqu no tienen en cuenta lo que podra costar con la ayuda del seguro (puede ser ms barato con su seguro), pero el sitio web puede darle el  precio si no utiliz ningn seguro.  - Puede imprimir el cupn correspondiente y llevarlo con su receta a la farmacia.  - Tambin puede pasar por nuestra oficina durante el horario de atencin regular y recoger una tarjeta de cupones de GoodRx.  - Si necesita que su receta se enve electrnicamente a una farmacia diferente, informe a nuestra oficina a travs de MyChart de [place] o por telfono llamando al [phone] y presione la opcin 4.  

## 2022-10-30 NOTE — Progress Notes (Signed)
   Follow-Up Visit   Subjective  Frank Fitzpatrick is a 77 y.o. male who presents for the following: suture removal of Atypical nevus excised of the L lower chest and BCC of L upper arm to EDC, check spot on tongue   The following portions of the chart were reviewed this encounter and updated as appropriate: medications, allergies, medical history  Review of Systems:  No other skin or systemic complaints except as noted in HPI or Assessment and Plan.  Objective  Well appearing patient in no apparent distress; mood and affect are within normal limits.   A focused examination was performed of the following areas: Chest, left arm  Relevant exam findings are noted in the Assessment and Plan.  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  Destruction of lesion  Destruction method: electrodesiccation and curettage   Informed consent: discussed and consent obtained   Timeout:  patient name, date of birth, surgical site, and procedure verified Anesthesia: the lesion was anesthetized in a standard fashion   Anesthetic:  1% lidocaine w/ epinephrine 1-100,000 buffered w/ 8.4% NaHCO3 Curettage performed in three different directions: Yes   Electrodesiccation performed over the curetted area: Yes   Final wound size (cm):  0.8 Hemostasis achieved with:  pressure, aluminum chloride and electrodesiccation Outcome: patient tolerated procedure well with no complications   Post-procedure details: wound care instructions given   Additional details:  Mupirocin ointment and Bandaid applied    ATYPICAL NEVUS  Exam: healing excision site L lower chest  Treatment Plan:  Encounter for Removal of Sutures - Incision site at the L lower chest is clean, dry and intact - Wound cleansed, sutures removed, wound cleansed and steri strips applied.  - Advised will call patient with pathology results   Path came in after pt left- margins free - Patient advised to keep steri-strips dry until they fall off. - Scars remodel for a full year. - Once steri-strips fall off, patient can apply over-the-counter silicone scar cream each night to help with scar remodeling if desired. - Patient advised to call with any concerns or if they notice any new or changing lesions.    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  - Recommend staying in the shade or wearing long sleeves, sun glasses (UVA+UVB protection) and wide brim hats (4-inch brim around the entire circumference of the hat). - Call for new or changing lesions.   Sebaceous Hyperplasia - Small yellow papules with a central dell - Benign-appearing - Observe. Call for changes.  - 4.51mm pink yellow papule mid chin  Return in about 6 months (around 05/02/2023) for UBSE, Hx of SCC, Hx of AKs. Recheck tongue  I, Sonya Hupman, RMA, am acting as scribe for Willeen Niece, MD .   Documentation: I have reviewed the above documentation for accuracy and completeness, and I agree with the above.  Willeen Niece, MD

## 2022-10-31 ENCOUNTER — Telehealth: Payer: Self-pay

## 2022-10-31 NOTE — Telephone Encounter (Signed)
-----   Message from Willeen Niece, MD sent at 10/30/2022  8:39 PM EDT ----- Skin (M), left lower chest EXCISION, RESIDUAL ATYPICAL MELANOCYTIC PROLIFERATION, MARGINS FREE   - please call patient

## 2022-10-31 NOTE — Telephone Encounter (Signed)
Advised pt's wife of pathology results/sh

## 2022-11-27 ENCOUNTER — Encounter: Payer: Medicare Other | Admitting: Dermatology

## 2022-12-04 ENCOUNTER — Ambulatory Visit: Payer: Medicare Other | Admitting: Dermatology

## 2023-03-12 ENCOUNTER — Ambulatory Visit: Payer: Medicare Other | Admitting: Dermatology

## 2023-05-03 ENCOUNTER — Emergency Department: Payer: Medicare Other

## 2023-05-03 ENCOUNTER — Encounter: Payer: Self-pay | Admitting: Medical Oncology

## 2023-05-03 ENCOUNTER — Emergency Department
Admission: EM | Admit: 2023-05-03 | Discharge: 2023-05-03 | Disposition: A | Discharge status: Home / Self Care | Payer: Medicare Other | Attending: Emergency Medicine | Admitting: Emergency Medicine

## 2023-05-03 ENCOUNTER — Other Ambulatory Visit: Payer: Self-pay

## 2023-05-03 DIAGNOSIS — E860 Dehydration: Secondary | ICD-10-CM | POA: Diagnosis not present

## 2023-05-03 DIAGNOSIS — Y92009 Unspecified place in unspecified non-institutional (private) residence as the place of occurrence of the external cause: Secondary | ICD-10-CM | POA: Insufficient documentation

## 2023-05-03 DIAGNOSIS — W010XXA Fall on same level from slipping, tripping and stumbling without subsequent striking against object, initial encounter: Secondary | ICD-10-CM | POA: Insufficient documentation

## 2023-05-03 DIAGNOSIS — W19XXXA Unspecified fall, initial encounter: Secondary | ICD-10-CM

## 2023-05-03 DIAGNOSIS — F0390 Unspecified dementia without behavioral disturbance: Secondary | ICD-10-CM | POA: Insufficient documentation

## 2023-05-03 DIAGNOSIS — S0990XA Unspecified injury of head, initial encounter: Secondary | ICD-10-CM | POA: Diagnosis present

## 2023-05-03 LAB — CBC WITH DIFFERENTIAL/PLATELET
Abs Immature Granulocytes: 0.02 10*3/uL (ref 0.00–0.07)
Basophils Absolute: 0.1 10*3/uL (ref 0.0–0.1)
Basophils Relative: 1 %
Eosinophils Absolute: 0.2 10*3/uL (ref 0.0–0.5)
Eosinophils Relative: 3 %
HCT: 40.9 % (ref 39.0–52.0)
Hemoglobin: 14.4 g/dL (ref 13.0–17.0)
Immature Granulocytes: 0 %
Lymphocytes Relative: 19 %
Lymphs Abs: 1.5 10*3/uL (ref 0.7–4.0)
MCH: 32.4 pg (ref 26.0–34.0)
MCHC: 35.2 g/dL (ref 30.0–36.0)
MCV: 91.9 fL (ref 80.0–100.0)
Monocytes Absolute: 0.7 10*3/uL (ref 0.1–1.0)
Monocytes Relative: 8 %
Neutro Abs: 5.6 10*3/uL (ref 1.7–7.7)
Neutrophils Relative %: 69 %
Platelets: 179 10*3/uL (ref 150–400)
RBC: 4.45 MIL/uL (ref 4.22–5.81)
RDW: 12.9 % (ref 11.5–15.5)
WBC: 8.1 10*3/uL (ref 4.0–10.5)
nRBC: 0 % (ref 0.0–0.2)

## 2023-05-03 LAB — BASIC METABOLIC PANEL
Anion gap: 13 (ref 5–15)
BUN: 25 mg/dL — ABNORMAL HIGH (ref 8–23)
CO2: 22 mmol/L (ref 22–32)
Calcium: 9.2 mg/dL (ref 8.9–10.3)
Chloride: 102 mmol/L (ref 98–111)
Creatinine, Ser: 1.05 mg/dL (ref 0.61–1.24)
GFR, Estimated: >60 mL/min (ref >60)
Glucose, Bld: 104 mg/dL — ABNORMAL HIGH (ref 70–99)
Potassium: 4 mmol/L (ref 3.5–5.1)
Sodium: 137 mmol/L (ref 135–145)

## 2023-05-03 LAB — URINALYSIS, ROUTINE W REFLEX MICROSCOPIC
Bilirubin Urine: NEGATIVE
Glucose, UA: NEGATIVE mg/dL
Hgb urine dipstick: NEGATIVE
Ketones, ur: 20 mg/dL — AB
Leukocytes,Ua: NEGATIVE
Nitrite: NEGATIVE
Protein, ur: NEGATIVE mg/dL
Specific Gravity, Urine: 1.027 (ref 1.005–1.030)
pH: 5 (ref 5.0–8.0)

## 2023-05-03 NOTE — ED Notes (Signed)
Dc instructions reviewed with pt and family member no questions or concerns at this time. Declined wheelchair and ambulated out of ED with steady gait.

## 2023-05-03 NOTE — ED Triage Notes (Addendum)
Pt with family who reports pt slipped on the leaves outside and fell this am. Denies injury. Pts family reports after fall pt has been talking "out of his head". Pt A/O x 3, unsure of current year. Denies blood thinners. Ambulatory without difficulty.

## 2023-05-03 NOTE — ED Notes (Signed)
Patient transported to X-ray 

## 2023-05-03 NOTE — Discharge Instructions (Signed)
Please follow-up with your outpatient provider as we discussed.  Please return for any new, worsening, or change in symptoms or other concerns.

## 2023-05-03 NOTE — ED Provider Notes (Signed)
[facility] Provider Note    Event Date/Time   First MD Initiated Contact with Patient 05/03/23 1058     (approximate)   History   Fall   HPI  Frank Fitzpatrick is a 77 y.o. male with a past medical history of dementia who presents today for evaluation of fall.  Patient presents with his son who reports that the patient's wife has been in the hospital for surgery and this has caused the patient's dementia to worsen.  Patient went over to the neighbors house today looking for his wife when he had a slip and fall in the driveway.  There was no LOC.  Patient has been able to ambulate since the event.  He has not had any nausea or vomiting.  The son reports that he has been more confused since his wife has left the house but he is walking normally, speaking normally, eating and drinking normally.  He is not on anticoagulation.  There are no problems to display for this patient.         Physical Exam   Triage Vital Signs: ED Triage Vitals [05/03/23 1018]  Encounter Vitals Group     BP (!) 159/85     Systolic BP Percentile      Diastolic BP Percentile      Pulse Rate 75     Resp 18     Temp 98.2 F (36.8 C)     Temp Source Oral     SpO2 94 %     Weight 145 lb (65.8 kg)     Height 5\' 7"  (1.702 m)     Head Circumference      Peak Flow      Pain Score 0     Pain Loc      Pain Education      Exclude from Growth Chart     Most recent vital signs: Vitals:   05/03/23 1018 05/03/23 1346  BP: (!) 159/85 (!) 155/78  Pulse: 75 75  Resp: 18 18  Temp: 98.2 F (36.8 C) 98.4 F (36.9 C)  SpO2: 94% 96%    Physical Exam Vitals and nursing note reviewed.  Constitutional:      General: Awake and alert. No acute distress.    Appearance: Normal appearance. The patient is normal weight.  HENT:     Head: Normocephalic and atraumatic.     Mouth: Mucous membranes are moist.  Eyes:     General: PERRL. Normal EOMs        Right eye: No discharge.         Left eye: No discharge.     Conjunctiva/sclera: Conjunctivae normal.  Cardiovascular:     Rate and Rhythm: Normal rate and regular rhythm.     Pulses: Normal pulses.  Pulmonary:     Effort: Pulmonary effort is normal. No respiratory distress.     Breath sounds: Normal breath sounds.  Abdominal:     Abdomen is soft. There is no abdominal tenderness. No rebound or guarding. No distention. Musculoskeletal:        General: No swelling. Normal range of motion.     Cervical back: Normal range of motion and neck supple. No midline cervical spine tenderness.  Full range of motion of neck.  Negative Spurling test.  Negative Lhermitte sign.  Normal strength and sensation in bilateral upper extremities. Normal grip strength bilaterally.  Normal intrinsic muscle function of the hand bilaterally.  Normal radial pulses bilaterally. Skin:  General: Skin is warm and dry.     Capillary Refill: Capillary refill takes less than 2 seconds.     Findings: No rash.  Neurological:     Mental Status: The patient is awake and alert.   Neurological: GCS 15 alert and oriented x3 Normal speech, no expressive or receptive aphasia or dysarthria Cranial nerves II through XII intact Normal visual fields 5 out of 5 strength in all 4 extremities with intact sensation throughout No extremity drift Normal finger-to-nose testing, no limb or truncal ataxia    ED Results / Procedures / Treatments   Labs (all labs ordered are listed, but only abnormal results are displayed) Labs Reviewed  BASIC METABOLIC PANEL - Abnormal; Notable for the following components:      Result Value   Glucose, Bld 104 (*)    BUN 25 (*)    All other components within normal limits  URINALYSIS, ROUTINE W REFLEX MICROSCOPIC - Abnormal; Notable for the following components:   Color, Urine YELLOW (*)    APPearance CLEAR (*)    Ketones, ur 20 (*)    All other components within normal limits  CBC WITH DIFFERENTIAL/PLATELET      EKG     RADIOLOGY I independently reviewed and interpreted imaging and agree with radiologists findings.     PROCEDURES:  Critical Care performed:   Procedures   MEDICATIONS ORDERED IN ED: Medications - No data to display   IMPRESSION / MDM / ASSESSMENT AND PLAN / ED COURSE  I reviewed the triage vital signs and the nursing notes.   Differential diagnosis includes, but is not limited to, worsening dementia, occult infection, intracranial hemorrhage, concussion.  Patient is awake and alert, hemodynamically stable and afebrile.  He is answering questions appropriately.  He has no focal neurological deficits.  He denies having any pain.  Further workup is indicated.  Basic blood work, urinalysis, and CT head and neck obtained.  CT head and neck are without any acute findings.  Lab work was largely unremarkable with exception of mild dehydration with a BUN to creatinine ratio of 2:1.  He was hydrated with oral fluids given that he is tolerating p.o. without difficulty.  Recommended close outpatient follow-up with his PCP.  Patient is answering questions appropriately, has a normal gait, has normal insight, and has returned to his baseline per his son at the bedside.  He is appropriate for outpatient management.  He was discharged in stable condition.   Patient's presentation is most consistent with acute complicated illness / injury requiring diagnostic workup.   FINAL CLINICAL IMPRESSION(S) / ED DIAGNOSES   Final diagnoses:  Fall, initial encounter  Injury of head, initial encounter     Rx / DC Orders   ED Discharge Orders     None        Note:  This document was prepared using Dragon voice recognition software and may include unintentional dictation errors.   Jackelyn Hoehn, PA-C 05/03/23 1356    Merwyn Katos, MD 05/04/23 [phone]

## 2023-05-03 NOTE — ED Notes (Signed)
See triage note States he slipped  Hitting his head  Family states he is slight confused

## 2023-05-14 ENCOUNTER — Ambulatory Visit: Payer: Medicare Other | Admitting: Dermatology

## 2023-08-01 ENCOUNTER — Ambulatory Visit: Payer: Medicare Other | Admitting: Podiatry

## 2023-08-01 ENCOUNTER — Encounter: Payer: Self-pay | Admitting: Podiatry

## 2023-08-01 DIAGNOSIS — L03031 Cellulitis of right toe: Secondary | ICD-10-CM

## 2023-08-01 DIAGNOSIS — B351 Tinea unguium: Secondary | ICD-10-CM

## 2023-08-01 DIAGNOSIS — M79675 Pain in left toe(s): Secondary | ICD-10-CM | POA: Diagnosis not present

## 2023-08-01 DIAGNOSIS — M79674 Pain in right toe(s): Secondary | ICD-10-CM | POA: Diagnosis not present

## 2023-08-01 DIAGNOSIS — L02611 Cutaneous abscess of right foot: Secondary | ICD-10-CM

## 2023-08-01 DIAGNOSIS — L97511 Non-pressure chronic ulcer of other part of right foot limited to breakdown of skin: Secondary | ICD-10-CM | POA: Diagnosis not present

## 2023-08-01 MED ORDER — CEPHALEXIN 500 MG PO CAPS: 500.0000 mg | ORAL_CAPSULE | Freq: Three times a day (TID) | ORAL | 0 refills | Status: AC

## 2023-08-01 MED ORDER — GENTAMICIN SULFATE 0.1 % EX CREA: 1.0000 | TOPICAL_CREAM | Freq: Every day | CUTANEOUS | 0 refills | Status: AC

## 2023-08-01 NOTE — Progress Notes (Signed)
  Subjective:  Patient ID: Frank Fitzpatrick, male    DOB: [birth year],  MRN: [id number]  Chief Complaint  Patient presents with   Nail Problem    Wife stated, He has some bad toenails.  His toenail is rubbing on that toe and it's sore.    78 y.o. male presents with the above complaint. History confirmed with patient.  Although toenails are affected with the worst are the bilateral great toes.  The right great toe nail has rubbed a sore on the second toe next to it.  Surgical COVID.  Fluid red over the last few days  Objective:  Physical Exam: warm, good capillary refill, no trophic changes or ulcerative lesions, normal DP and PT pulses, normal sensory exam, and thickened elongated mycotic dystrophic toenails x 10 severe dystrophy and thickening and rams horn nail deformity of the bilateral great toenail, there is a partial-thickness skin ulcer on the medial second PIPJ with cellulitis to the MTP joint no exposed bone tendon or joint.  Assessment:   1. Pain due to onychomycosis of toenails of both feet   2. Cellulitis and abscess of toe, right   3. Skin ulcer of second toe, right, limited to breakdown of skin [facility])      Plan:  Patient was evaluated and treated and all questions answered.  Discussed the etiology and treatment options for the condition in detail with the patient.  Do not think topical or oral options will be able to correct the severe infection at this point. Recommended debridement of the nails today. Sharp and mechanical debridement performed of all painful and mycotic nails today. Nails debrided in length and thickness using a nail nipper to level of comfort.  We also discussed the option of a permanent total matricectomy of the bilateral great toenails  He has a wound developing from the right great toenail rubbing on the second toe medially with skin breakdown and cellulitis.  Recommended changing daily with gentamicin  ointment and Rx for cephalexin  to pharmacy for  the cellulitis.  Follow-up with me in 2 weeks to reevaluate expect this should be able to heal uneventfully.  Discussed signs symptoms of worsening infection and they should notify me if this develops.    Return in about 2 weeks (around 08/15/2023) for wound care.

## 2023-08-15 ENCOUNTER — Ambulatory Visit: Payer: Medicare Other | Admitting: Podiatry

## 2023-08-22 ENCOUNTER — Ambulatory Visit: Payer: Medicare Other | Admitting: Dermatology

## 2023-08-22 DIAGNOSIS — L738 Other specified follicular disorders: Secondary | ICD-10-CM

## 2023-08-22 DIAGNOSIS — C44311 Basal cell carcinoma of skin of nose: Secondary | ICD-10-CM

## 2023-08-22 DIAGNOSIS — C4492 Squamous cell carcinoma of skin, unspecified: Secondary | ICD-10-CM

## 2023-08-22 DIAGNOSIS — K148 Other diseases of tongue: Secondary | ICD-10-CM

## 2023-08-22 DIAGNOSIS — C44329 Squamous cell carcinoma of skin of other parts of face: Secondary | ICD-10-CM

## 2023-08-22 DIAGNOSIS — C44320 Squamous cell carcinoma of skin of unspecified parts of face: Secondary | ICD-10-CM

## 2023-08-22 DIAGNOSIS — D485 Neoplasm of uncertain behavior of skin: Secondary | ICD-10-CM

## 2023-08-22 DIAGNOSIS — D492 Neoplasm of unspecified behavior of bone, soft tissue, and skin: Secondary | ICD-10-CM

## 2023-08-22 DIAGNOSIS — Z85828 Personal history of other malignant neoplasm of skin: Secondary | ICD-10-CM

## 2023-08-22 DIAGNOSIS — W908XXA Exposure to other nonionizing radiation, initial encounter: Secondary | ICD-10-CM

## 2023-08-22 DIAGNOSIS — L578 Other skin changes due to chronic exposure to nonionizing radiation: Secondary | ICD-10-CM

## 2023-08-22 DIAGNOSIS — D219 Benign neoplasm of connective and other soft tissue, unspecified: Secondary | ICD-10-CM

## 2023-08-22 DIAGNOSIS — L820 Inflamed seborrheic keratosis: Secondary | ICD-10-CM | POA: Diagnosis not present

## 2023-08-22 HISTORY — DX: Squamous cell carcinoma of skin, unspecified: C44.92

## 2023-08-22 NOTE — Patient Instructions (Addendum)

## 2023-08-22 NOTE — Progress Notes (Signed)
 Follow-Up Visit   Subjective  Frank Fitzpatrick is a 78 y.o. male who presents for the following: Several spots of concern. He has a new spot on his right cheek, a rough spot on his right medial knee, and spots on his nose that bleed at times.   The patient has spots, moles and lesions to be evaluated, some may be new or changing and the patient may have concern these could be cancer. History of BCC, SCC, and Atypical nevus.   The following portions of the chart were reviewed this encounter and updated as appropriate: medications, allergies, medical history  Review of Systems:  No other skin or systemic complaints except as noted in HPI or Assessment and Plan.  Objective  Well appearing patient in no apparent distress; mood and affect are within normal limits.  A focused examination was performed of the following areas: Upper body  Relevant physical exam findings are noted in the Assessment and Plan.  bilateral lower knees (2) Erythematous stuck-on, waxy papule or plaque nasal dorsum 6 mm pink crusted papule   right malar cheek 5.0 mm keratotic papule    Assessment & Plan   INFLAMED SEBORRHEIC KERATOSIS (2) bilateral lower knees (2) vs Hypertrophic Aks.  Symptomatic, irritating, patient would like treated. Destruction of lesion - bilateral lower knees (2)  Destruction method: cryotherapy   Informed consent: discussed and consent obtained   Lesion destroyed using liquid nitrogen: Yes   Region frozen until ice ball extended beyond lesion: Yes   Outcome: patient tolerated procedure well with no complications   Post-procedure details: wound care instructions given   Additional details:  Prior to procedure, discussed risks of blister formation, small wound, skin dyspigmentation, or rare scar following cryotherapy. Recommend Vaseline ointment to treated areas while healing.  NEOPLASM OF UNCERTAIN BEHAVIOR OF SKIN (2) nasal dorsum Epidermal / dermal shaving  Lesion  diameter (cm):  0.6 Informed consent: discussed and consent obtained   Patient was prepped and draped in usual sterile fashion: Area prepped with alcohol. Anesthesia: the lesion was anesthetized in a standard fashion   Anesthetic:  1% lidocaine w/ epinephrine 1-100,000 buffered w/ 8.4% NaHCO3 Instrument used: flexible razor blade   Hemostasis achieved with: pressure, aluminum chloride and electrodesiccation   Outcome: patient tolerated procedure well    Destruction of lesion  Destruction method: electrodesiccation and curettage   Informed consent: discussed and consent obtained   Curettage performed in three different directions: Yes   Electrodesiccation performed over the curetted area: Yes   Final wound size (cm):  0.7 Hemostasis achieved with:  pressure, aluminum chloride and electrodesiccation Outcome: patient tolerated procedure well with no complications   Post-procedure details: wound care instructions given   Post-procedure details comment:  Ointment and bandage applied. Specimen 1 - Surgical pathology Differential Diagnosis: Inflamed SK vs Inflamed Cyst r/o SCC Check Margins: No EDC performed today right malar cheek Epidermal / dermal shaving  Lesion diameter (cm):  0.5 Informed consent: discussed and consent obtained   Patient was prepped and draped in usual sterile fashion: Area prepped with alcohol. Anesthesia: the lesion was anesthetized in a standard fashion   Anesthetic:  1% lidocaine w/ epinephrine 1-100,000 buffered w/ 8.4% NaHCO3 Instrument used: flexible razor blade   Hemostasis achieved with: pressure, aluminum chloride and electrodesiccation   Outcome: patient tolerated procedure well    Destruction of lesion  Destruction method: electrodesiccation and curettage   Informed consent: discussed and consent obtained   Curettage performed in three different directions:  Yes   Electrodesiccation performed over the curetted area: Yes   Final wound size (cm):   0.6 Hemostasis achieved with:  pressure, aluminum chloride and electrodesiccation Outcome: patient tolerated procedure well with no complications   Post-procedure details: wound care instructions given   Post-procedure details comment:  Ointment and bandage applied. Specimen 2 - Surgical pathology Differential Diagnosis: Inflamed SK r/o SCC Check Margins: No EDC performed today EDC would leave a round depressed whitish scar about the same size as the original lesion.  It is treated here in office in a procedure we call "scrape and burn".  No further pathology would be performed.  Mid to high 80% cure rate.  Mohs would leave a linear scar and pathology would be done at time of procedure to ensure complete removal.  It has a high 90s% cure rate. We would refer patient to a specialist for Mohs surgery.  Patient prefers to have EDC in office today, does not want to drive out of town for Mohs.   Shave removal and EDC x 2 performed today.   ACTINIC DAMAGE - chronic, secondary to cumulative UV radiation exposure/sun exposure over time - diffuse scaly erythematous macules with underlying dyspigmentation - Recommend daily broad spectrum sunscreen SPF 30+ to sun-exposed areas, reapply every 2 hours as needed.  - Recommend staying in the shade or wearing long sleeves, sun glasses (UVA+UVB protection) and wide brim hats (4-inch brim around the entire circumference of the hat). - Call for new or changing lesions.  Sebaceous Hyperplasia - Small yellow papules with a central dell face - Benign-appearing - Observe. Call for changes.  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 Not bothersome to patient. Stable. Benign-appearing, Observe.  Return in about 3 months (around 11/19/2023) for UBSE, Hx BCC, Hx SCC.  ICherlyn Labella, CMA, am acting as scribe for Willeen Niece, MD  .   Documentation: I have reviewed the above documentation for accuracy and completeness, and I agree with the above.  Willeen Niece, MD

## 2023-08-24 LAB — SURGICAL PATHOLOGY

## 2023-08-27 ENCOUNTER — Telehealth: Payer: Self-pay

## 2023-08-27 NOTE — Telephone Encounter (Signed)
 Advised patient of biopsy results. Will recheck both areas at follow-up appointment.

## 2023-08-27 NOTE — Telephone Encounter (Signed)
-----   Message from Willeen Niece sent at 08/27/2023 12:46 PM EST ----- 1. Skin, nasal dorsum :       BASAL CELL CARCINOMA, NODULAR PATTERN   2. Skin, right malar cheek :       WELL DIFFERENTIATED SQUAMOUS CELL CARCINOMA WITH SUPERFICIAL INFILTRATION    1. BCC skin cancer- already treated with EDC at time of biopsy 2. SCC skin cancer- already treated with EDC at time of biopsy   - please call patient

## 2023-09-05 ENCOUNTER — Ambulatory Visit: Payer: Medicare Other | Admitting: Podiatry

## 2023-09-05 ENCOUNTER — Encounter: Payer: Self-pay | Admitting: Podiatry

## 2023-09-05 DIAGNOSIS — B351 Tinea unguium: Secondary | ICD-10-CM | POA: Diagnosis not present

## 2023-09-05 DIAGNOSIS — M79675 Pain in left toe(s): Secondary | ICD-10-CM

## 2023-09-05 DIAGNOSIS — M79674 Pain in right toe(s): Secondary | ICD-10-CM

## 2023-09-05 NOTE — Progress Notes (Signed)
  Subjective:  Patient ID: Frank Fitzpatrick, male    DOB: [birth year],  MRN: [id number]  Chief Complaint  Patient presents with   Wound Check    "They're fine."    78 y.o. male presents with the above complaint. History confirmed with patient.  Doing much better now  Objective:  Physical Exam: warm, good capillary refill, no trophic changes or ulcerative lesions, normal DP and PT pulses, normal sensory exam, and thickened elongated mycotic dystrophic toenails x 10 severe dystrophy and thickening and rams horn nail deformity of the bilateral great toenail, ulceration has healed completely  Assessment:   1. Pain due to onychomycosis of toenails of both feet       Plan:  Patient was evaluated and treated and all questions answered.  Doing much better.  No ulceration actively.  He is going to have follow-up scheduled with Dr. Eloy End for [facility] on a routine basis with his wife.  Follow-up with me as needed if there is recurrence or needs reevaluation.  Discussed with him permanent removal of the nail plate may be a solution in the future if this is a recurrent problem   Return if symptoms worsen or fail to improve.

## 2023-11-16 ENCOUNTER — Ambulatory Visit: Admitting: Podiatry

## 2023-11-16 DIAGNOSIS — M79675 Pain in left toe(s): Secondary | ICD-10-CM

## 2023-11-16 DIAGNOSIS — M79674 Pain in right toe(s): Secondary | ICD-10-CM

## 2023-11-16 DIAGNOSIS — B351 Tinea unguium: Secondary | ICD-10-CM | POA: Diagnosis not present

## 2023-11-20 ENCOUNTER — Encounter: Payer: Self-pay | Admitting: Podiatry

## 2023-11-20 NOTE — Progress Notes (Signed)
  Subjective:  Patient ID: Frank Fitzpatrick, male    DOB: Dec 11, 1945,  MRN: [id number]  78 y.o. male presents painful elongated mycotic toenails 1-5 bilaterally which are tender when wearing enclosed shoe gear. Pain is relieved with periodic professional debridement.  Chief Complaint  Patient presents with   Nail Problem    RFC   New problem(s): None   PCP is Monique Ano, MD , and last visit was June 21, 2023.  No Known Allergies  Review of Systems: Negative except as noted in the HPI.   Objective:  Frank Fitzpatrick is a pleasant 78 y.o. male in NAD. AAO x 3.  Vascular Examination: Vascular status intact b/l with palpable pedal pulses. CFT immediate b/l. Pedal hair present. No edema. No pain with calf compression b/l. Skin temperature gradient WNL b/l. No varicosities noted. No cyanosis or clubbing noted.  Neurological Examination: Sensation grossly intact b/l with 10 gram monofilament. Vibratory sensation intact b/l.  Dermatological Examination: Pedal skin with normal turgor, texture and tone b/l. No open wounds nor interdigital macerations noted. Toenails 1-5 b/l thick, discolored, elongated with subungual debris and pain on dorsal palpation. No hyperkeratotic lesions noted b/l.   Musculoskeletal Examination: Muscle strength 5/5 to b/l LE.  No pain, crepitus noted b/l. No gross pedal deformities. Patient ambulates independently without assistive aids.   Radiographs: None  Last A1c:       No data to display           Assessment:   1. Pain due to onychomycosis of toenails of both feet    Plan:  Patient was evaluated and treated. All patient's and/or POA's questions/concerns addressed on today's visit. Toenails 1-5 debrided in length and girth without incident. Continue soft, supportive shoe gear daily. Report any pedal injuries to medical professional. Call office if there are any questions/concerns. -Patient/POA to call should there be  question/concern in the interim.  Return in about 3 months (around 02/16/2024).  Frank Fitzpatrick, DPM      [place] LOCATION: [address], [place] [address]                   Office [phone]   [facility] LOCATION: [address] [place], [place] [address] Office [phone]

## 2023-11-27 ENCOUNTER — Ambulatory Visit: Payer: Medicare Other | Admitting: Dermatology

## 2023-11-27 DIAGNOSIS — W908XXA Exposure to other nonionizing radiation, initial encounter: Secondary | ICD-10-CM

## 2023-11-27 DIAGNOSIS — S1081XA Abrasion of other specified part of neck, initial encounter: Secondary | ICD-10-CM

## 2023-11-27 DIAGNOSIS — L820 Inflamed seborrheic keratosis: Secondary | ICD-10-CM

## 2023-11-27 DIAGNOSIS — L578 Other skin changes due to chronic exposure to nonionizing radiation: Secondary | ICD-10-CM

## 2023-11-27 DIAGNOSIS — D492 Neoplasm of unspecified behavior of bone, soft tissue, and skin: Secondary | ICD-10-CM | POA: Diagnosis not present

## 2023-11-27 DIAGNOSIS — D1801 Hemangioma of skin and subcutaneous tissue: Secondary | ICD-10-CM

## 2023-11-27 DIAGNOSIS — L821 Other seborrheic keratosis: Secondary | ICD-10-CM

## 2023-11-27 DIAGNOSIS — L738 Other specified follicular disorders: Secondary | ICD-10-CM | POA: Diagnosis not present

## 2023-11-27 DIAGNOSIS — T148XXA Other injury of unspecified body region, initial encounter: Secondary | ICD-10-CM

## 2023-11-27 DIAGNOSIS — Z85828 Personal history of other malignant neoplasm of skin: Secondary | ICD-10-CM

## 2023-11-27 DIAGNOSIS — Z1283 Encounter for screening for malignant neoplasm of skin: Secondary | ICD-10-CM | POA: Diagnosis not present

## 2023-11-27 DIAGNOSIS — L814 Other melanin hyperpigmentation: Secondary | ICD-10-CM

## 2023-11-27 DIAGNOSIS — C44311 Basal cell carcinoma of skin of nose: Secondary | ICD-10-CM | POA: Diagnosis not present

## 2023-11-27 DIAGNOSIS — D229 Melanocytic nevi, unspecified: Secondary | ICD-10-CM

## 2023-11-27 NOTE — Progress Notes (Signed)
 Follow-Up Visit   Subjective  Frank Fitzpatrick is a 78 y.o. male who presents for the following: Skin Cancer Screening and Upper Body Skin Exam. Patient with hx BCC, SCC, and AKs. Place at nose, not really itchy or sore.    The patient presents for Upper Body Skin Exam (UBSE) for skin cancer screening and mole check. The patient has spots, moles and lesions to be evaluated, some may be new or changing and the patient may have concern these could be cancer.   The following portions of the chart were reviewed this encounter and updated as appropriate: medications, allergies, medical history  Review of Systems:  No other skin or systemic complaints except as noted in HPI or Assessment and Plan.  Objective  Well appearing patient in no apparent distress; mood and affect are within normal limits.  All skin waist up examined. Relevant physical exam findings are noted in the Assessment and Plan.    Right nasal tip 7mm pearly pink flesh papule  L upper back x1 Stuck on waxy paps with erythema Right Buccal Cheek Yellow white papules with dilated pore on the mid forehead and right temple.   Assessment & Plan   NEOPLASM OF SKIN Right nasal tip Skin / nail biopsy Type of biopsy: tangential   Informed consent: discussed and consent obtained   Anesthesia: the lesion was anesthetized in a standard fashion   Anesthesia comment:  Area prepped with alcohol Anesthetic:  1% lidocaine  w/ epinephrine  1-100,000 buffered w/ 8.4% NaHCO3 Instrument used: flexible razor blade   Hemostasis achieved with: pressure, aluminum chloride and electrodesiccation   Outcome: patient tolerated procedure well   Post-procedure details: wound care instructions given   Post-procedure details comment:  Ointment and small bandage applied  Destruction of lesion  Destruction method: electrodesiccation and curettage   Informed consent: discussed and consent obtained   Curettage performed in three different  directions: Yes   Electrodesiccation performed over the curetted area: Yes   Lesion length (cm):  1 Hemostasis achieved with:  pressure, aluminum chloride and electrodesiccation Outcome: patient tolerated procedure well with no complications   Post-procedure details: wound care instructions given   Additional details:  Mupirocin ointment and Bandaid applied  Specimen 1 - Surgical pathology Differential Diagnosis: R/O BCC  Check Margins: No 7mm pearly pink flesh papule EDC today  Discussed if biopsy proves BCC would refer to [facility] at [facility].  Patient declines Mohs surgery because unable to drive/travel and he prefers to have EDC done in office today at time of biopsy. Will treat today and recheck in 6 months. Patient advised if it recurs, then will have to send to [facility].   Patient has had previous MOHs done by Dr. Robert Chimes at [facility].  INFLAMED SEBORRHEIC KERATOSIS L upper back x1 Symptomatic, irritating, patient would like treated. Destruction of lesion - L upper back x1  Destruction method: cryotherapy   Informed consent: discussed and consent obtained   Lesion destroyed using liquid nitrogen: Yes   Region frozen until ice ball extended beyond lesion: Yes   Outcome: patient tolerated procedure well with no complications   Post-procedure details: wound care instructions given   Additional details:  Prior to procedure, discussed risks of blister formation, small wound, skin dyspigmentation, or rare scar following cryotherapy. Recommend Vaseline ointment to treated areas while healing.  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  Observation.  Call clinic for new or changing lesions.  Recommend daily use of broad spectrum spf 30+ sunscreen  to sun-exposed areas.     Skin cancer screening performed today.  Actinic Damage - Chronic condition, secondary to cumulative UV/sun exposure - diffuse scaly erythematous macules with underlying dyspigmentation - Recommend daily  broad spectrum sunscreen SPF 30+ to sun-exposed areas, reapply every 2 hours as needed.  - Staying in the shade or wearing long sleeves, sun glasses (UVA+UVB protection) and wide brim hats (4-inch brim around the entire circumference of the hat) are also recommended for sun protection.  - Call for new or changing lesions.  Lentigines, Seborrheic Keratoses, Hemangiomas - Benign normal skin lesions - Benign-appearing - Call for any changes  Melanocytic Nevi - Tan-brown and/or pink-flesh-colored symmetric macules and papules - Benign appearing on exam today - Observation - Call clinic for new or changing moles - Recommend daily use of broad spectrum spf 30+ sunscreen to sun-exposed areas.   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  - Call if any new or changing lesions are noted between office visits  Excoriation  Exam: Pink excoriated papules with purpura x 3 L posterior neck, R posterior neck is clear today   Treatment plan: R neck clear. L neck not itchy per patient. Benign-appearing. Observe.    Seborrheic keratosis with adjacent lentigo. Left Chest Exam: 1.2 cm waxy speckled patch, features of collision lentigo/SK under dermoscopy from previous visit- with significant fading on exam today. See new photo   Treatment Plan:  Resolving. Benign-appearing.  Observation.  Call clinic for new or  changing lesions.  Recommend daily use of broad spectrum spf 30+ sunscreen to sun-exposed areas.        Return in about 6 months (around 05/28/2024) for w/ Dr. Annette Barters, recheck biopsy at nose.  I, Jacquelynn V. Grier Leber, CMA, am acting as scribe for Artemio Larry, MD .   Documentation: I have reviewed the above documentation for accuracy and completeness, and I agree with the above.  Artemio Larry, MD

## 2023-11-27 NOTE — Patient Instructions (Addendum)
 Electrodesiccation and Curettage ("Scrape and Burn") Wound Care Instructions  Leave the original bandage on for 24 hours if possible.  If the bandage becomes soaked or soiled before that time, it is OK to remove it and examine the wound.  A small amount of post-operative bleeding is normal.  If excessive bleeding occurs, remove the bandage, place gauze over the site and apply continuous pressure (no peeking) over the area for 30 minutes. If this does not work, please call our clinic as soon as possible or page your doctor if it is after hours.   Once a day, cleanse the wound with soap and water. It is fine to shower. If a thick crust develops you may use a Q-tip dipped into dilute hydrogen peroxide (mix 1:1 with water) to dissolve it.  Hydrogen peroxide can slow the healing process, so use it only as needed.    After washing, apply petroleum jelly (Vaseline) or an antibiotic ointment if your doctor prescribed one for you, followed by a bandage.    For best healing, the wound should be covered with a layer of ointment at all times. If you are not able to keep the area covered with a bandage to hold the ointment in place, this may mean re-applying the ointment several times a day.  Continue this wound care until the wound has healed and is no longer open. It may take several weeks for the wound to heal and close.  Itching and mild discomfort is normal during the healing process.  If you have any discomfort, you can take Tylenol  (acetaminophen ) or ibuprofen as directed on the bottle. (Please do not take these if you have an allergy to them or cannot take them for another reason).  Some redness, tenderness and white or yellow material in the wound is normal healing.  If the area becomes very sore and red, or develops a thick yellow-green material (pus), it may be infected; please notify us .    Wound healing continues for up to one year following surgery. It is not unusual to experience pain in the scar  from time to time during the interval.  If the pain becomes severe or the scar thickens, you should notify the office.    A slight amount of redness in a scar is expected for the first six months.  After six months, the redness will fade and the scar will soften and fade.  The color difference becomes less noticeable with time.  If there are any problems, return for a post-op surgery check at your earliest convenience.  To improve the appearance of the scar, you can use silicone scar gel, cream, or sheets (such as Mederma or Serica) every night for up to one year. These are available over the counter (without a prescription).  Please call our office at [phone] for any questions or concerns.   Cryotherapy Aftercare  Wash gently with soap and water everyday.   Apply Vaseline and Band-Aid daily until healed.     Recommend daily broad spectrum sunscreen SPF 30+ to sun-exposed areas, reapply every 2 hours as needed. Call for new or changing lesions.  Staying in the shade or wearing long sleeves, sun glasses (UVA+UVB protection) and wide brim hats (4-inch brim around the entire circumference of the hat) are also recommended for sun protection.     Melanoma ABCDEs  Melanoma is the most dangerous type of skin cancer, and is the leading cause of death from skin disease.  You are more likely to  develop melanoma if you: Have light-colored skin, light-colored eyes, or red or blond hair Spend a lot of time in the sun Tan regularly, either outdoors or in a tanning bed Have had blistering sunburns, especially during childhood Have a close family member who has had a melanoma Have atypical moles or large birthmarks  Early detection of melanoma is key since treatment is typically straightforward and cure rates are extremely high if we catch it early.   The first sign of melanoma is often a change in a mole or a new dark spot.  The ABCDE system is a way of remembering the signs of melanoma.  A  for asymmetry:  The two halves do not match. B for border:  The edges of the growth are irregular. C for color:  A mixture of colors are present instead of an even brown color. D for diameter:  Melanomas are usually (but not always) greater than 6mm - the size of a pencil eraser. E for evolution:  The spot keeps changing in size, shape, and color.  Please check your skin once per month between visits. You can use a small mirror in front and a large mirror behind you to keep an eye on the back side or your body.   If you see any new or changing lesions before your next follow-up, please call to schedule a visit.  Please continue daily skin protection including broad spectrum sunscreen SPF 30+ to sun-exposed areas, reapplying every 2 hours as needed when you're outdoors.   Staying in the shade or wearing long sleeves, sun glasses (UVA+UVB protection) and wide brim hats (4-inch brim around the entire circumference of the hat) are also recommended for sun protection.     Due to recent changes in healthcare laws, you may see results of your pathology and/or laboratory studies on MyChart before the doctors have had a chance to review them. We understand that in some cases there may be results that are confusing or concerning to you. Please understand that not all results are received at the same time and often the doctors may need to interpret multiple results in order to provide you with the best plan of care or course of treatment. Therefore, we ask that you please give us  2 business days to thoroughly review all your results before contacting the office for clarification. Should we see a critical lab result, you will be contacted sooner.   If You Need Anything After Your Visit  If you have any questions or concerns for your doctor, please call our main line at [phone] and press option 4 to reach your doctor's medical assistant. If no one answers, please leave a voicemail as directed and we will  return your call as soon as possible. Messages left after 4 pm will be answered the following business day.   You may also send us  a message via MyChart. We typically respond to MyChart messages within 1-2 business days.  For prescription refills, please ask your pharmacy to contact our office. Our fax number is [phone].  If you have an urgent issue when the clinic is closed that cannot wait until the next business day, you can page your doctor at the number below.    Please note that while we do our best to be available for urgent issues outside of office hours, we are not available 24/7.   If you have an urgent issue and are unable to reach us , you may choose to seek medical care at  your doctor's office, retail clinic, urgent care center, or emergency room.  If you have a medical emergency, please immediately call 911 or go to the emergency department.  Pager Numbers  - Dr. Bary Likes: [phone]  - Dr. Annette Barters: [phone]  - Dr. Felipe Horton: [phone]   In the event of inclement weather, please call our main line at [phone] for an update on the status of any delays or closures.  Dermatology Medication Tips: Please keep the boxes that topical medications come in in order to help keep track of the instructions about where and how to use these. Pharmacies typically print the medication instructions only on the boxes and not directly on the medication tubes.   If your medication is too expensive, please contact our office at [phone] option 4 or send us  a message through MyChart.   We are unable to tell what your co-pay for medications will be in advance as this is different depending on your insurance coverage. However, we may be able to find a substitute medication at lower cost or fill out paperwork to get insurance to cover a needed medication.   If a prior authorization is required to get your medication covered by your insurance company, please allow us  1-2 business  days to complete this process.  Drug prices often vary depending on where the prescription is filled and some pharmacies may offer cheaper prices.  The website www.goodrx.com contains coupons for medications through different pharmacies. The prices here do not account for what the cost may be with help from insurance (it may be cheaper with your insurance), but the website can give you the price if you did not use any insurance.  - You can print the associated coupon and take it with your prescription to the pharmacy.  - You may also stop by our office during regular business hours and pick up a GoodRx coupon card.  - If you need your prescription sent electronically to a different pharmacy, notify our office through [facility] or by phone at [phone] option 4.     Si Usted Necesita Algo Despus de Su Visita  Tambin puede enviarnos un mensaje a travs de Clinical cytogeneticist. Por lo general respondemos a los mensajes de MyChart en el transcurso de 1 a 2 das hbiles.  Para renovar recetas, por favor pida a su farmacia que se ponga en contacto con nuestra oficina. Franz Jacks de fax es [place] [phone].  Si tiene un asunto urgente cuando la clnica est cerrada y que no puede esperar hasta el siguiente da hbil, puede llamar/localizar a su doctor(a) al nmero que aparece a continuacin.   Por favor, tenga en cuenta que aunque hacemos todo lo posible para estar disponibles para asuntos urgentes fuera del horario de [place], no estamos disponibles las 24 horas del da, los 7 [address]  [address] de la [place].   Si tiene un problema urgente y no puede comunicarse con nosotros, puede optar por buscar atencin mdica  en el consultorio de su doctor(a), en una clnica privada, en un centro de atencin urgente o en una sala de emergencias.  Si tiene Engineer, drilling, por favor llame inmediatamente al 911 o vaya a la sala de emergencias.  Nmeros de bper  - Dr. Bary Likes: [phone]  - Dra. Annette Barters:  [phone]  - Dr. Felipe Horton: [phone]   En caso de inclemencias del tiempo, por favor llame a [place] principal al [phone] para una actualizacin sobre el [place] de cualquier retraso o cierre.  Consejos para la medicacin  en dermatologa: Por favor, guarde las cajas en las que vienen los medicamentos de uso tpico para ayudarle a seguir las instrucciones sobre dnde y cmo usarlos. Las farmacias generalmente imprimen las instrucciones del medicamento slo en las cajas y no directamente en los tubos del [place].   Si su medicamento es muy caro, por favor, pngase en contacto con Bettyjane Brunet llamando al [phone] y presione la opcin 4 o envenos un mensaje a travs de Clinical cytogeneticist.   No podemos decirle cul ser su copago por los medicamentos por adelantado ya que esto es diferente dependiendo de la cobertura de su seguro. Sin embargo, es posible que podamos encontrar un medicamento sustituto a Audiological scientist un formulario para que el seguro cubra el medicamento que se considera necesario.   Si se requiere una autorizacin previa para que su compaa de seguros [place] su medicamento, por favor permtanos de 1 a 2 das hbiles para completar este proceso.  Los precios de los medicamentos varan con frecuencia dependiendo del Environmental consultant de dnde se surte la receta y alguna farmacias pueden ofrecer precios ms baratos.  El sitio web www.goodrx.com tiene cupones para medicamentos de Health and safety inspector. Los precios aqu no tienen en cuenta lo que podra costar con la ayuda del seguro (puede ser ms barato con su seguro), pero el sitio web puede darle el precio si no utiliz Tourist information centre manager.  - Puede imprimir el cupn correspondiente y llevarlo con su receta a la farmacia.  - Tambin puede pasar por nuestra oficina durante el horario de atencin regular y Education officer, museum una tarjeta de cupones de GoodRx.  - Si necesita que su receta se enve electrnicamente a una farmacia diferente, informe  a nuestra oficina a travs de MyChart de [place] o por telfono llamando al [phone] y presione la opcin 4.

## 2023-11-29 LAB — SURGICAL PATHOLOGY

## 2023-12-03 ENCOUNTER — Ambulatory Visit: Payer: Self-pay | Admitting: Dermatology

## 2023-12-03 ENCOUNTER — Encounter: Payer: Self-pay | Admitting: Dermatology

## 2023-12-03 NOTE — Telephone Encounter (Signed)
-----   Message from Artemio Larry sent at 12/03/2023 12:56 PM EDT ----- 1. Skin, right nasal tip :       BASAL CELL CARCINOMA, NODULAR PATTERN (TREATED WITH ED&C)   BCC skin cancer- already treated with EDC at time of biopsy, will recheck area at f/up.  If any recurrence will need Mohs surgery.   - please call patient

## 2023-12-03 NOTE — Telephone Encounter (Signed)
 Advised patient's wife, Lorelee Roger, [place] results./sh

## 2024-01-08 ENCOUNTER — Other Ambulatory Visit: Payer: Self-pay | Admitting: Family Medicine

## 2024-01-08 DIAGNOSIS — F02B0 Dementia in other diseases classified elsewhere, moderate, without behavioral disturbance, psychotic disturbance, mood disturbance, and anxiety: Secondary | ICD-10-CM

## 2024-01-16 ENCOUNTER — Ambulatory Visit

## 2024-01-21 ENCOUNTER — Ambulatory Visit: Admitting: Podiatry

## 2024-01-21 ENCOUNTER — Encounter: Payer: Self-pay | Admitting: Podiatry

## 2024-01-21 DIAGNOSIS — M79674 Pain in right toe(s): Secondary | ICD-10-CM

## 2024-01-21 DIAGNOSIS — B351 Tinea unguium: Secondary | ICD-10-CM | POA: Diagnosis not present

## 2024-01-21 DIAGNOSIS — M79675 Pain in left toe(s): Secondary | ICD-10-CM

## 2024-01-21 NOTE — Progress Notes (Signed)
  Subjective:  Patient ID: Frank Fitzpatrick, male    DOB: 1945/09/23,  MRN: [id number]  78 y.o. male presents painful thick toenails that are difficult to trim. Pain interferes with ambulation. Aggravating factors include wearing enclosed shoe gear. Pain is relieved with periodic professional debridement. He is accompanied by his wife on today's visit. Chief Complaint  Patient presents with   RFC    Rm1 Routine Foot Care/ Not diabetic/Dr Linthavong last visit July 2025    New problem(s): None   PCP is Alla Amis, MD.  No Known Allergies  Review of Systems: Negative except as noted in the HPI.   Objective:  Frank Fitzpatrick is a pleasant 78 y.o. male WD, WN in NAD. AAO x 3.  Vascular Examination: Vascular status intact b/l with palpable pedal pulses. CFT immediate b/l. Pedal hair present. No edema. No pain with calf compression b/l. Skin temperature gradient WNL b/l. No varicosities noted. No cyanosis or clubbing noted.  Neurological Examination: Sensation grossly intact b/l with 10 gram monofilament. Vibratory sensation intact b/l.  Dermatological Examination: Pedal skin with normal turgor, texture and tone b/l. No open wounds nor interdigital macerations noted. Toenails 1-5 b/l thick, discolored, elongated with subungual debris and pain on dorsal palpation. No hyperkeratotic lesions noted b/l.   Musculoskeletal Examination: Muscle strength 5/5 to b/l LE.  No pain, crepitus noted b/l. No gross pedal deformities. Patient ambulates independently without assistive aids.   Radiographs: None Assessment:   1. Pain due to onychomycosis of toenails of both feet    Plan:  Consent given for treatment. Patient examined. All patient's and/or POA's questions/concerns addressed on today's visit. Mycotic toenails 1-5 debrided in length and girth without incident. Continue soft, supportive shoe gear daily. Report any pedal injuries to medical professional. Call office if there  are any quesitons/concerns. -Patient/POA to call should there be question/concern in the interim.  Return in about 3 months (around 04/22/2024).  [name], DPM      [facility] LOCATION: [address] [place] [address]                   Office [phone]   [facility] LOCATION: [address] [place], [place] [address] Office [phone]

## 2024-03-31 ENCOUNTER — Ambulatory Visit: Admitting: Podiatry

## 2024-03-31 DIAGNOSIS — M79675 Pain in left toe(s): Secondary | ICD-10-CM | POA: Diagnosis not present

## 2024-03-31 DIAGNOSIS — M79674 Pain in right toe(s): Secondary | ICD-10-CM

## 2024-03-31 DIAGNOSIS — B351 Tinea unguium: Secondary | ICD-10-CM

## 2024-04-06 ENCOUNTER — Encounter: Payer: Self-pay | Admitting: Podiatry

## 2024-04-06 NOTE — Progress Notes (Signed)
  Subjective:  Patient ID: Frank Fitzpatrick, male    DOB: 11-14-1945,  MRN: [id number]  Frank Fitzpatrick presents to clinic today for preventative diabetic foot care for painful mycotic toenails x 10 which interfere with daily activities. Pain is relieved with periodic professional debridement.  Chief Complaint  Patient presents with   Toe Pain    RFC. Dr. Alla at [facility] is his PCP. Last visit was in August 2025.   New problem(s): None.   PCP is Alla Amis, MD.  No Known Allergies  Review of Systems: Negative except as noted in the HPI.  Objective: No changes noted in today's physical examination. There were no vitals filed for this visit. Frank Fitzpatrick is a pleasant 78 y.o. male WD, WN in NAD. AAO x 3.  Neurovascular status intact bilaterally and symmetrically.  Dermatological Examination: Pedal skin with normal turgor, texture and tone b/l. No open wounds nor interdigital macerations noted. Toenails 1-5 b/l thick, discolored, elongated with subungual debris and pain on dorsal palpation. No hyperkeratotic lesions noted b/l.   Musculoskeletal Examination: Muscle strength 5/5 to b/l LE.  No pain, crepitus noted b/l. No gross pedal deformities. Patient ambulates independently without assistive aids.   Assessment/Plan: 1. Pain due to onychomycosis of toenails of both feet   Patient was evaluated and treated. All patient's and/or POA's questions/concerns addressed on today's visit. Toenails 1-5 debrided in length and girth without incident. Continue soft, supportive shoe gear daily. Report any pedal injuries to medical professional. Call office if there are any questions/concerns. -Patient/POA to call should there be question/concern in the interim.   Return in about 10 weeks (around 06/09/2024).  Frank Fitzpatrick, DPM      [place] LOCATION: [address], [place] [address]                    Office [phone]   [facility] LOCATION: [address] [place], [place] [address] Office [phone]

## 2024-06-02 ENCOUNTER — Ambulatory Visit: Admitting: Dermatology

## 2024-06-02 DIAGNOSIS — L821 Other seborrheic keratosis: Secondary | ICD-10-CM | POA: Diagnosis not present

## 2024-06-02 DIAGNOSIS — L820 Inflamed seborrheic keratosis: Secondary | ICD-10-CM

## 2024-06-02 DIAGNOSIS — L814 Other melanin hyperpigmentation: Secondary | ICD-10-CM | POA: Diagnosis not present

## 2024-06-02 DIAGNOSIS — L578 Other skin changes due to chronic exposure to nonionizing radiation: Secondary | ICD-10-CM | POA: Diagnosis not present

## 2024-06-02 DIAGNOSIS — W908XXA Exposure to other nonionizing radiation, initial encounter: Secondary | ICD-10-CM

## 2024-06-02 DIAGNOSIS — L729 Follicular cyst of the skin and subcutaneous tissue, unspecified: Secondary | ICD-10-CM

## 2024-06-02 DIAGNOSIS — D235 Other benign neoplasm of skin of trunk: Secondary | ICD-10-CM

## 2024-06-02 DIAGNOSIS — D692 Other nonthrombocytopenic purpura: Secondary | ICD-10-CM | POA: Diagnosis not present

## 2024-06-02 DIAGNOSIS — Z85828 Personal history of other malignant neoplasm of skin: Secondary | ICD-10-CM

## 2024-06-02 DIAGNOSIS — L219 Seborrheic dermatitis, unspecified: Secondary | ICD-10-CM

## 2024-06-02 MED ORDER — KETOCONAZOLE 2 % EX CREA: TOPICAL_CREAM | CUTANEOUS | 3 refills | Status: AC

## 2024-06-02 NOTE — Patient Instructions (Addendum)

## 2024-06-02 NOTE — Progress Notes (Signed)
 Follow-Up Visit   Subjective  Frank Fitzpatrick is a 78 y.o. male who presents for the following: 6 month follow-up. Right nasal tip with biopsy proven BCC, treated with EDC last visit. He also has a few spots on the right med inf knee, back, and arms.   The patient has spots, moles and lesions to be evaluated, some may be new or changing.  The following portions of the chart were reviewed this encounter and updated as appropriate: medications, allergies, medical history  Review of Systems:  No other skin or systemic complaints except as noted in HPI or Assessment and Plan.  Objective  Well appearing patient in no apparent distress; mood and affect are within normal limits.  A focused examination was performed of the following areas: Face, back, right leg  Relevant physical exam findings are noted in the Assessment and Plan.  R inferior knee x 1 Erythematous stuck-on, keratotic papule  BCC bx/edc site - right nasal tip  Assessment & Plan  ACTINIC DAMAGE - chronic, secondary to cumulative UV radiation exposure/sun exposure over time - diffuse scaly erythematous macules with underlying dyspigmentation - Recommend daily broad spectrum sunscreen SPF 30+ to sun-exposed areas, reapply every 2 hours as needed.  - Recommend staying in the shade or wearing long sleeves, sun glasses (UVA+UVB protection) and wide brim hats (4-inch brim around the entire circumference of the hat). - Call for new or changing lesions.  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 Appears clear clinically, some serpentine vessels under dermoscopy. Will recheck on follow-up. Photo taken today. Other BCCs, see History. - No evidence of recurrence today - Recommend regular full body skin exams - Recommend daily broad spectrum sunscreen SPF 30+ to sun-exposed areas, reapply every 2 hours as needed.  - Call if any new or changing lesions are noted between office  visits  SEBORRHEIC KERATOSIS - Stuck-on, waxy, tan-brown papules and/or plaques at the back - Benign-appearing - Discussed benign etiology and prognosis. - Observe - Call for any changes  LENTIGINES Exam: scattered tan macules Due to sun exposure Treatment Plan: Benign-appearing, observe. Recommend daily broad spectrum sunscreen SPF 30+ to sun-exposed areas, reapply every 2 hours as needed.  Call for any changes  Purpura - Chronic; persistent and recurrent.  Treatable, but not curable. - Violaceous macules and patches - Benign - Related to trauma, age, sun damage and/or use of blood thinners, chronic use of topical and/or oral steroids - Observe - Can use OTC arnica containing moisturizer such as Dermend Bruise Formula if desired - Call for worsening or other concerns  SEBORRHEIC DERMATITIS Exam: Pink patches with greasy scale at brows and perinasal  Chronic and persistent condition with duration or expected duration over one year. Condition is bothersome/symptomatic for patient. Currently flared.   Seborrheic Dermatitis is a chronic persistent rash characterized by pinkness and scaling most commonly of the mid face but also can occur on the scalp (dandruff), ears; mid chest, mid back and groin.  It tends to be exacerbated by stress and cooler weather.  People who have neurologic disease may experience new onset or exacerbation of existing seborrheic dermatitis.  The condition is not curable but treatable and can be controlled.  Treatment Plan: Apply ketoconazole  2% cream once to twice a day as needed    DILATED PORES Exam: dilated pores at the back  Benign. Observation.   INFLAMED SEBORRHEIC KERATOSIS R inferior knee x 1 Symptomatic, irritating, patient would like treated.  Destruction of lesion - R inferior knee x 1  Destruction method: cryotherapy   Informed consent: discussed and consent obtained   Lesion destroyed using liquid nitrogen: Yes   Region frozen until  ice ball extended beyond lesion: Yes   Outcome: patient tolerated procedure well with no complications   Post-procedure details: wound care instructions given   Additional details:  Prior to procedure, discussed risks of blister formation, small wound, skin dyspigmentation, or rare scar following cryotherapy. Recommend Vaseline ointment to treated areas while healing.     Return in about 6 months (around 12/01/2024) for UBSE, AKs, Hx BCC.  IAndrea Kerns, CMA, am acting as scribe for Rexene Rattler, MD .   Documentation: I have reviewed the above documentation for accuracy and completeness, and I agree with the above.  Rexene Rattler, MD

## 2024-06-12 ENCOUNTER — Ambulatory Visit: Admitting: Podiatry

## 2024-06-12 ENCOUNTER — Encounter: Payer: Self-pay | Admitting: Podiatry

## 2024-06-12 DIAGNOSIS — M79675 Pain in left toe(s): Secondary | ICD-10-CM

## 2024-06-12 DIAGNOSIS — B351 Tinea unguium: Secondary | ICD-10-CM | POA: Diagnosis not present

## 2024-06-12 DIAGNOSIS — M79674 Pain in right toe(s): Secondary | ICD-10-CM | POA: Diagnosis not present

## 2024-06-15 NOTE — Progress Notes (Signed)
"  °  Subjective:  Patient ID: Frank Fitzpatrick, male    DOB: August 01, 1945,  MRN: [id number]  Frank Fitzpatrick presents to clinic today for painful mycotic toenails x 10 which interfere with daily activities. Pain is relieved with periodic professional debridement.  Chief Complaint  Patient presents with   Nail Problem    Thick painful toenails, 9 week follow up    New problem(s): None.   PCP is Alla Amis, MD.  Allergies[1]  Review of Systems: Negative except as noted in the HPI.  Objective: No changes noted in today's physical examination. There were no vitals filed for this visit. Frank Fitzpatrick is a pleasant 78 y.o. male WD, WN in NAD. AAO x 3.  Vascular Examination: Capillary refill time immediate b/l. Palpable pedal pulses. Pedal hair present b/l. Pedal edema absent. No pain with calf compression b/l. Skin temperature gradient WNL b/l. No cyanosis or clubbing b/l. No ischemia or gangrene noted b/l.   Neurological Examination: Sensation grossly intact b/l with 10 gram monofilament. Vibratory sensation intact b/l.   Dermatological Examination: Pedal skin with normal turgor, texture and tone b/l.  No open wounds. No interdigital macerations.   Toenails 1-5 b/l thick, discolored, elongated with subungual debris and pain on dorsal palpation.    No corns, calluses, nor porokeratotic lesions.  Musculoskeletal Examination: Muscle strength 5/5 to all lower extremity muscle groups bilaterally. No pain, crepitus or joint limitation noted with ROM b/l LE. No gross bony pedal deformities b/l. Patient ambulates independently without assistive aids.  Radiographs: None  Assessment/Plan: 1. Pain due to onychomycosis of toenails of both feet     -Patient with h/o dementia/Alzheimer's/cognitive deficit. Patient's family member present. All questions/concerns addressed on today's visit. -Patient's family member present. All questions/concerns addressed on today's  visit. -Patient to continue soft, supportive shoe gear daily. -Mycotic toenails 1-5 bilaterally were debrided in length and girth with sterile nail nippers and dremel without incident. -Patient/POA to call should there be question/concern in the interim.   Return in about 3 months (around 09/10/2024).  Frank Fitzpatrick, DPM      [place] LOCATION: [address], [place] [address]                   Office [phone]   [facility] LOCATION: [address] [place], [place] [address] Office [phone]     [1] No Known Allergies  "

## 2024-06-15 NOTE — Progress Notes (Deleted)
"  °  Subjective:  Patient ID: Frank FORBES Trudy DOUGLAS, male    DOB: 21-Aug-1945,  MRN: [id number]  Frank Fitzpatrick presents to clinic today for {jgcomplaint:23593}  Chief Complaint  Patient presents with   Nail Problem    Thick painful toenails, 9 week follow up    New problem(s): None. {jgcomplaint:23593}  PCP is Alla Amis, MD.  Allergies[1]  Review of Systems: Negative except as noted in the HPI.  Objective: No changes noted in today's physical examination. There were no vitals filed for this visit. Frank Fitzpatrick is a pleasant 78 y.o. male WD, WN in NAD. AAO x 3.  .Vascular Examination: Capillary refill time immediate b/l. Palpable pedal pulses. Pedal hair present b/l. Pedal edema absent. No pain with calf compression b/l. Skin temperature gradient WNL b/l. No cyanosis or clubbing b/l. No ischemia or gangrene noted b/l.   Neurological Examination: Sensation grossly intact b/l with 10 gram monofilament. Vibratory sensation intact b/l.   Dermatological Examination: Pedal skin with normal turgor, texture and tone b/l.  No open wounds. No interdigital macerations.   Toenails 1-5 b/l thick, discolored, elongated with subungual debris and pain on dorsal palpation.   No corns, calluses, nor porokeratotic lesions.  Musculoskeletal Examination: Muscle strength 5/5 to all lower extremity muscle groups bilaterally. No pain, crepitus or joint limitation noted with ROM b/l LE. No gross bony pedal deformities b/l. Patient ambulates independently without assistive aids.  Radiographs: None  {jgxrayfindings:23683}  Last A1c:       No data to display          Assessment/Plan: 1. Pain due to onychomycosis of toenails of both feet     No orders of the defined types were placed in this encounter.   None {Jgplan:23602::-Patient/POA to call should there be question/concern in the interim.}   Return in about 3 months (around 09/10/2024).  Frank Fitzpatrick,  DPM      [place] LOCATION: [address] [place] [address]                   Office [phone]   [facility] LOCATION: [address] [place], [place] [address] Office [phone]     [1] No Known Allergies  "

## 2024-09-11 ENCOUNTER — Ambulatory Visit: Admitting: Podiatry

## 2024-12-08 ENCOUNTER — Ambulatory Visit: Admitting: Dermatology
